# Patient Record
Sex: Male | Born: 1965 | Race: Black or African American | Hispanic: No | Marital: Single | Smoking: Never smoker
Health system: Southern US, Community
[De-identification: ages and names within clinical notes are randomized; demographics above are authoritative.]

## PROBLEM LIST (undated history)

## (undated) DIAGNOSIS — E119 Type 2 diabetes mellitus without complications: Secondary | ICD-10-CM

## (undated) HISTORY — PX: INGUINAL HERNIA REPAIR: SUR1180

## (undated) HISTORY — PX: OTHER SURGICAL HISTORY: SHX169

---

## 2001-02-13 ENCOUNTER — Emergency Department (HOSPITAL_COMMUNITY): Admission: EM | Admit: 2001-02-13 | Discharge: 2001-02-13 | Payer: Self-pay

## 2002-12-06 ENCOUNTER — Emergency Department (HOSPITAL_COMMUNITY): Admission: EM | Admit: 2002-12-06 | Discharge: 2002-12-06 | Payer: Self-pay | Admitting: Emergency Medicine

## 2003-01-31 ENCOUNTER — Encounter: Payer: Self-pay | Admitting: Emergency Medicine

## 2003-01-31 ENCOUNTER — Emergency Department (HOSPITAL_COMMUNITY): Admission: EM | Admit: 2003-01-31 | Discharge: 2003-01-31 | Payer: Self-pay | Admitting: Emergency Medicine

## 2003-06-11 ENCOUNTER — Emergency Department (HOSPITAL_COMMUNITY): Admission: EM | Admit: 2003-06-11 | Discharge: 2003-06-11 | Payer: Self-pay | Admitting: Emergency Medicine

## 2005-02-27 ENCOUNTER — Emergency Department (HOSPITAL_COMMUNITY): Admission: EM | Admit: 2005-02-27 | Discharge: 2005-02-27 | Payer: Self-pay | Admitting: Emergency Medicine

## 2006-03-11 ENCOUNTER — Emergency Department (HOSPITAL_COMMUNITY): Admission: EM | Admit: 2006-03-11 | Discharge: 2006-03-11 | Payer: Self-pay | Admitting: Emergency Medicine

## 2006-04-10 ENCOUNTER — Ambulatory Visit (HOSPITAL_COMMUNITY): Admission: RE | Admit: 2006-04-10 | Discharge: 2006-04-10 | Payer: Self-pay | Admitting: Ophthalmology

## 2010-01-18 ENCOUNTER — Encounter
Admission: RE | Admit: 2010-01-18 | Discharge: 2010-01-18 | Payer: Self-pay | Source: Home / Self Care | Attending: Endocrinology | Admitting: Endocrinology

## 2010-03-20 ENCOUNTER — Encounter
Admission: RE | Admit: 2010-03-20 | Discharge: 2010-05-08 | Payer: Self-pay | Source: Home / Self Care | Attending: Endocrinology | Admitting: Endocrinology

## 2010-06-12 ENCOUNTER — Ambulatory Visit: Payer: Self-pay | Admitting: Dietician

## 2010-06-12 ENCOUNTER — Encounter: Admit: 2010-06-12 | Payer: Self-pay | Attending: Obstetrics and Gynecology | Admitting: Obstetrics and Gynecology

## 2010-06-12 ENCOUNTER — Encounter: Payer: BC Managed Care – PPO | Attending: Endocrinology | Admitting: Dietician

## 2010-06-12 DIAGNOSIS — Z713 Dietary counseling and surveillance: Secondary | ICD-10-CM | POA: Insufficient documentation

## 2010-06-12 DIAGNOSIS — E109 Type 1 diabetes mellitus without complications: Secondary | ICD-10-CM | POA: Insufficient documentation

## 2010-08-24 NOTE — Op Note (Signed)
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   SURGERY:  Ahmed glaucoma valve placement with pericardial patch graft,  right eye.   ANESTHESIA:  Local peribulbar with MAC.   SURGEON:  Trish Fountain, MD   ESTIMATED BLOOD LOSS:  5 mL.   COMPLICATIONS:  None.   HISTORY:  This is a 45 year old type 1 diabetic whose blood sugars have  never been under very good control.  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  The following is a description of the procedure.   After the right eye was anesthetized using 0.75% Marcaine and 2%  lidocaine with epinephrine in a peribulbar injection with Wydase, the  right eye was prepped and draped in the usual sterile manner. A  lid  speculum was placed in the right eye.  A 5-0 silk suture was placed  through the superior cornea near the limbus in order to rotate the eye  inferiorly and this was clipped with mosquito forceps to the drape in  order to rotate the eye inferiorly.  A sharp Westcott scissors was used  at the 12 o'clock position with conjunctival forceps.  A peritomy was  created at the 12 o'clock position approximately from the 1 o'clock to  the 11 o'clock position superiorly.  This peritomy was extended  backwards underneath the conjunctiva in  wide sweeps using the blunt  Westcott scissors.  Once the conjunctiva was freed up from a large  portion of the superior area of the sclera, the Ahmed valve was placed  beneath the conjunctiva and sewed into position using two 10-0 nylon  sutures which were attached to the sclera to position the Ahmed valve  superiorly.  The Ahmed valve tube was then placed into the anterior  chamber after making a small incision at the limbus into the anterior  chamber just anterior to the iris.  The Ahmed valve was then covered  with pericardial graft tissue in order to keep the overlying conjunctiva  from eroding.  The conjunctiva was pulled down over the Ahmed valve and  over the fitted pericardial patch graft and sewn into position over the  limbus using 5-0 Vicryl locking sutures.  Once this was performed, the  anterior chamber was checked to make certain that the anterior chamber  was not flat.  There was a small bleb formation surrounding the Ahmed  valve.  The 6-0 silk suture was removed from  the  corneal limbus and antibiotic drops were placed in the patient's right  eye.  Betadine solution 5% was placed in the medial and lateral canthus  for approximately 1 minute and then rinsed from the eye.  The lid  speculum was removed from the eye and the patient went to the recovery  room in satisfactory condition and is to follow up in 1 day.      Trish Fountain, MD  Electronically Signed     PVK/MEDQ  D:  10/15/2006  T:  10/16/2006  Job:  [id number]

## 2010-09-17 ENCOUNTER — Encounter: Payer: BC Managed Care – PPO | Attending: Endocrinology | Admitting: Dietician

## 2010-09-17 DIAGNOSIS — Z713 Dietary counseling and surveillance: Secondary | ICD-10-CM | POA: Insufficient documentation

## 2010-09-17 DIAGNOSIS — E109 Type 1 diabetes mellitus without complications: Secondary | ICD-10-CM | POA: Insufficient documentation

## 2012-04-28 ENCOUNTER — Emergency Department (HOSPITAL_COMMUNITY)
Admission: EM | Admit: 2012-04-28 | Discharge: 2012-04-28 | Disposition: A | Discharge status: Home / Self Care | Payer: BC Managed Care – PPO | Attending: Emergency Medicine | Admitting: Emergency Medicine

## 2012-04-28 ENCOUNTER — Encounter (HOSPITAL_COMMUNITY): Payer: Self-pay | Admitting: Neurology

## 2012-04-28 ENCOUNTER — Emergency Department (HOSPITAL_COMMUNITY): Payer: BC Managed Care – PPO

## 2012-04-28 DIAGNOSIS — E1169 Type 2 diabetes mellitus with other specified complication: Secondary | ICD-10-CM | POA: Insufficient documentation

## 2012-04-28 DIAGNOSIS — R110 Nausea: Secondary | ICD-10-CM | POA: Insufficient documentation

## 2012-04-28 DIAGNOSIS — Z79899 Other long term (current) drug therapy: Secondary | ICD-10-CM | POA: Insufficient documentation

## 2012-04-28 DIAGNOSIS — Z794 Long term (current) use of insulin: Secondary | ICD-10-CM | POA: Insufficient documentation

## 2012-04-28 DIAGNOSIS — R5381 Other malaise: Secondary | ICD-10-CM | POA: Insufficient documentation

## 2012-04-28 DIAGNOSIS — Z7982 Long term (current) use of aspirin: Secondary | ICD-10-CM | POA: Insufficient documentation

## 2012-04-28 DIAGNOSIS — T68XXXA Hypothermia, initial encounter: Secondary | ICD-10-CM

## 2012-04-28 DIAGNOSIS — R680 Hypothermia, not associated with low environmental temperature: Secondary | ICD-10-CM | POA: Insufficient documentation

## 2012-04-28 DIAGNOSIS — R5383 Other fatigue: Secondary | ICD-10-CM | POA: Insufficient documentation

## 2012-04-28 DIAGNOSIS — E162 Hypoglycemia, unspecified: Secondary | ICD-10-CM

## 2012-04-28 HISTORY — DX: Type 2 diabetes mellitus without complications: E11.9

## 2012-04-28 LAB — COMPREHENSIVE METABOLIC PANEL
ALT: 23 U/L (ref 0–53)
Alkaline Phosphatase: 74 U/L (ref 39–117)
BUN: 15 mg/dL (ref 6–23)
CO2: 22 mEq/L (ref 19–32)
Chloride: 105 mEq/L (ref 96–112)
GFR calc Af Amer: >90 mL/min (ref >90)
GFR calc non Af Amer: >90 mL/min (ref >90)
Glucose, Bld: 115 mg/dL — ABNORMAL HIGH (ref 70–99)
Potassium: 4.1 mEq/L (ref 3.5–5.1)
Sodium: 139 mEq/L (ref 135–145)
Total Bilirubin: 0.5 mg/dL (ref 0.3–1.2)

## 2012-04-28 LAB — CBC WITH DIFFERENTIAL/PLATELET
Eosinophils Absolute: 0.1 10*3/uL (ref 0.0–0.7)
Hemoglobin: 15.9 g/dL (ref 13.0–17.0)
Lymphocytes Relative: 22 % (ref 12–46)
Lymphs Abs: 1.1 10*3/uL (ref 0.7–4.0)
MCH: 30.8 pg (ref 26.0–34.0)
Monocytes Relative: 6 % (ref 3–12)
Neutro Abs: 3.6 10*3/uL (ref 1.7–7.7)
Neutrophils Relative %: 71 % (ref 43–77)
RBC: 5.16 MIL/uL (ref 4.22–5.81)
WBC: 5.1 10*3/uL (ref 4.0–10.5)

## 2012-04-28 LAB — POCT I-STAT TROPONIN I: Troponin i, poc: 0 ng/mL (ref 0.00–0.08)

## 2012-04-28 LAB — LIPASE, BLOOD: Lipase: 15 U/L (ref 11–59)

## 2012-04-28 LAB — GLUCOSE, CAPILLARY: Glucose-Capillary: 67 mg/dL — ABNORMAL LOW (ref 70–99)

## 2012-04-28 MED ORDER — DEXTROSE-NACL 5-0.9 % IV SOLN
Freq: Once | INTRAVENOUS | Status: AC
  Administered 2012-04-28: 13:00:00 via INTRAVENOUS

## 2012-04-28 MED ORDER — DEXTROSE 50 % IV SOLN
50.0000 mL | Freq: Once | INTRAVENOUS | Status: AC
  Administered 2012-04-28: 50 mL via INTRAVENOUS

## 2012-04-28 MED ORDER — DEXTROSE 50 % IV SOLN
INTRAVENOUS | Status: AC
  Filled 2012-04-28: qty 50

## 2012-04-28 NOTE — ED Notes (Signed)
Dr Bebe Shaggy informed of swallowing screen , blood sugar and rectal temp - Ok given to give pt something to eat.  Sandwich and OJ set up for pt, eatting at this time .

## 2012-04-28 NOTE — ED Notes (Signed)
cbg was 161 informed rn

## 2012-04-28 NOTE — ED Notes (Signed)
accucheck 101

## 2012-04-28 NOTE — ED Notes (Signed)
CBG was 39. Notified Nurse Kara Mead.

## 2012-04-28 NOTE — ED Notes (Signed)
accucheck 67

## 2012-04-28 NOTE — ED Notes (Signed)
Per ems- CBG 28 given 12.5 glucose, increased to 112. Pt on monitor HR 30-40. EKG SB. Most recent CBG 97 PTA. Pt is alert and oriented. Denies any pain or sob. Pt very diaphoretic initially. Speech is slurred which is normal per baseline. BP 152/90, RR 20-22, 100% RA. 18 L. AC.

## 2012-04-28 NOTE — ED Notes (Signed)
Attempts to obtain blood have been unsuccessful - 2nd IV sited 18# and unable to get blood for labs- Flushed well . Lab called to come and attempt to obtain blood again

## 2012-04-28 NOTE — ED Provider Notes (Signed)
History     CSN: [id number]  Arrival date & time 04/28/12  1231   First MD Initiated Contact with Patient 04/28/12 1236      Chief Complaint  Patient presents with  . Hypoglycemia    Patient is a 47 y.o. male presenting with weakness. The history is provided by the patient.  Weakness The primary symptoms include nausea. Episode onset: earlier today. The symptoms are unchanged. The neurological symptoms are diffuse.  Additional symptoms include weakness.  his symptoms are improved by glucose His symptoms are worsened by nothing  Pt presents hypoglycemia.  Per EMS, pt was found to have glucose of 28 and he was given glucose and he improved He reports he takes humalog, last dose was this morning He denies headache/cp/sob.  No HA is reported.     Past Medical History  Diagnosis Date  . Diabetes mellitus without complication     No past surgical history on file.  No family history on file.  History  Substance Use Topics  . Smoking status: Not on file  . Smokeless tobacco: Not on file  . Alcohol Use:       Review of Systems  Constitutional: Positive for fatigue.  Respiratory: Negative for shortness of breath.   Gastrointestinal: Positive for nausea.  Musculoskeletal: Negative for back pain.  Skin: Positive for color change.  Neurological: Positive for weakness.  Psychiatric/Behavioral: Negative for agitation.  All other systems reviewed and are negative.    Allergies  Review of patient's allergies indicates no known allergies.  Home Medications  No current outpatient prescriptions on file.   Physical Exam CONSTITUTIONAL: pt appears somnolent but arousable.  He is diaphoretic HEAD AND FACE: Normocephalic/atraumatic EYES: evidence of prior surgery noted, no acute trauma noted ENMT: Mucous membranes moist NECK: supple no meningeal signs CV: S1/S2 noted, no murmurs/rubs/gallops noted LUNGS: Lungs are clear to auscultation bilaterally, no apparent  distress ABDOMEN: soft, nontender, no rebound or guarding GU:no cva tenderness NEURO: Pt is somnolent but easily arousable, moves all extremitiesx4, no arm or leg drift.   EXTREMITIES: pulses normal, full ROM SKIN: warm, color normal PSYCH: no abnormalities of mood noted  ED Course  Procedures    Labs Reviewed  CBC WITH DIFFERENTIAL  COMPREHENSIVE METABOLIC PANEL  LIPASE, BLOOD    12:50 PM Pt seen on arrival.  He is diaphoretic and somnolent.  Suspect recurrent hypoglycemia.  Will follow closey 4:01 PM Pt now at baseline (mother confirmed) He reports he takes metformin in the evening and humalog the morning His glucose is improving He will need to be monitored in the ED to ensure it is improving and that his hypothermia is improving D/w dr Juleen China to f/u on repeat CBG and temp check He will need hold all diabetic meds (humalog/lantua/metforming) until he follows up with endocrinology  MDM  Nursing notes including past medical history and social history reviewed and considered in documentation Previous records reviewed and considered Labs/vital reviewed and considered         Date: 04/28/2012  Rate: 42  Rhythm: sinus bradycardia  QRS Axis: normal  Intervals: normal  ST/T Wave abnormalities: nonspecific ST changes  Conduction Disutrbances:none  Narrative Interpretation:   Old EKG Reviewed: changes noted and rate is slower today    Joya Gaskins, MD 04/28/12 1603

## 2012-04-28 NOTE — ED Provider Notes (Signed)
Assumed care from Dr Bebe Shaggy in sign out. Pt's glucose has stabilized. Pt's mother is now at bedside and says pt is at his baseline. Now euthermic as well. Feel safe for DC. Pt instructed to hold diabetes meds for the rest of the day. Return precautions discussed.   Raeford Razor, MD 04/28/12 1630

## 2012-12-11 ENCOUNTER — Other Ambulatory Visit: Payer: Self-pay | Admitting: Family Medicine

## 2012-12-11 DIAGNOSIS — N5082 Scrotal pain: Secondary | ICD-10-CM

## 2012-12-16 ENCOUNTER — Other Ambulatory Visit: Payer: Self-pay | Admitting: Family Medicine

## 2012-12-16 DIAGNOSIS — K4090 Unilateral inguinal hernia, without obstruction or gangrene, not specified as recurrent: Secondary | ICD-10-CM

## 2012-12-17 ENCOUNTER — Ambulatory Visit
Admission: RE | Admit: 2012-12-17 | Discharge: 2012-12-17 | Disposition: A | Discharge status: Home / Self Care | Payer: BC Managed Care – PPO | Source: Ambulatory Visit | Attending: Family Medicine | Admitting: Family Medicine

## 2012-12-17 ENCOUNTER — Other Ambulatory Visit: Payer: BC Managed Care – PPO

## 2012-12-17 DIAGNOSIS — K4090 Unilateral inguinal hernia, without obstruction or gangrene, not specified as recurrent: Secondary | ICD-10-CM

## 2012-12-17 MED ORDER — IOHEXOL 300 MG/ML  SOLN
100.0000 mL | Freq: Once | INTRAMUSCULAR | Status: AC | PRN
  Administered 2012-12-17: 100 mL via INTRAVENOUS

## 2012-12-24 ENCOUNTER — Encounter (INDEPENDENT_AMBULATORY_CARE_PROVIDER_SITE_OTHER): Payer: Self-pay | Admitting: General Surgery

## 2012-12-24 ENCOUNTER — Telehealth (INDEPENDENT_AMBULATORY_CARE_PROVIDER_SITE_OTHER): Payer: Self-pay

## 2012-12-24 ENCOUNTER — Ambulatory Visit (INDEPENDENT_AMBULATORY_CARE_PROVIDER_SITE_OTHER): Payer: BC Managed Care – PPO | Admitting: General Surgery

## 2012-12-24 VITALS — BP 118/80 | HR 58 | Resp 16 | Ht 72.0 in | Wt 148.0 lb

## 2012-12-24 DIAGNOSIS — K4090 Unilateral inguinal hernia, without obstruction or gangrene, not specified as recurrent: Secondary | ICD-10-CM

## 2012-12-24 NOTE — Progress Notes (Signed)
Subjective:     Patient ID: Edward Greer, male   DOB: 19-Feb-1966, 47 y.o.   MRN: [id number]  HPI The patient is a 47 year old male who is referred by Dr. Gerda Diss for evaluation of right inguinal hernia. Patient states that approximately a week ago he noticed a heavy sensation to his right inguinal area. Patient underwent CT scan which revealed a very small hernia containing fat. The patient states she has no pain on that side is able to go about his daily activities and his recreational activities. His main concern is that of it becoming emergent. He states he has no shown no bulgehas had no signs or symptoms of incarceration  Of note patient's has had previous open bilateral inguinal hernia repair at the age of 20.  Review of Systems  Constitutional: Negative.   HENT: Negative.   Respiratory: Negative.   Cardiovascular: Negative.   Gastrointestinal: Negative.   Neurological: Negative.   All other systems reviewed and are negative.       Objective:   Physical Exam  Constitutional: He is oriented to person, place, and time. He appears well-developed and well-nourished.  HENT:  Head: Normocephalic and atraumatic.  Eyes: Conjunctivae and EOM are normal. Pupils are equal, round, and reactive to light.  Neck: Normal range of motion. Neck supple.  Cardiovascular: Normal rate, regular rhythm and normal heart sounds.   Pulmonary/Chest: Effort normal and breath sounds normal.  Abdominal: Soft. Bowel sounds are normal. A hernia is present. Hernia confirmed positive in the right inguinal area. Hernia confirmed negative in the left inguinal area.  Musculoskeletal: Normal range of motion.  Neurological: He is alert and oriented to person, place, and time.  Skin: Skin is warm and dry.       Assessment:     47 year old male with an asymptomatic right inguinal hernia     Plan:     1. I discussed with the patient the risks and benefits of repair, and the ability and to continue with his  daily activities and recreational activities. I do not believe this would become incarcerated emergent seen. Since he is minimal discomfort at this time and no pain I believe this does not do so had to be fixed at this point. 2. If his hernia becomes more symptomatic he can return for laparoscopic hernia repair.

## 2012-12-24 NOTE — Telephone Encounter (Signed)
Called and left message for patient RE:  appt need's to be r/s due to Dr. Biagio Quint not being in office today.  Dr. Derrell Lolling will see patient today at scheduled time of 9:15 or we can reschedule to 12/31/12 @ 9:15am or 01/01/13 @ 11:15 w/Dr. Biagio Quint

## 2012-12-31 ENCOUNTER — Encounter (INDEPENDENT_AMBULATORY_CARE_PROVIDER_SITE_OTHER): Payer: Self-pay

## 2013-03-19 ENCOUNTER — Emergency Department (HOSPITAL_COMMUNITY)
Admission: EM | Admit: 2013-03-19 | Discharge: 2013-03-20 | Disposition: A | Discharge status: Home / Self Care | Payer: BC Managed Care – PPO | Attending: Emergency Medicine | Admitting: Emergency Medicine

## 2013-03-19 ENCOUNTER — Emergency Department (HOSPITAL_COMMUNITY): Payer: BC Managed Care – PPO

## 2013-03-19 ENCOUNTER — Encounter (HOSPITAL_COMMUNITY): Payer: Self-pay | Admitting: Emergency Medicine

## 2013-03-19 DIAGNOSIS — E1169 Type 2 diabetes mellitus with other specified complication: Secondary | ICD-10-CM | POA: Insufficient documentation

## 2013-03-19 DIAGNOSIS — Z79899 Other long term (current) drug therapy: Secondary | ICD-10-CM | POA: Insufficient documentation

## 2013-03-19 DIAGNOSIS — Y9289 Other specified places as the place of occurrence of the external cause: Secondary | ICD-10-CM | POA: Insufficient documentation

## 2013-03-19 DIAGNOSIS — IMO0002 Reserved for concepts with insufficient information to code with codable children: Secondary | ICD-10-CM | POA: Insufficient documentation

## 2013-03-19 DIAGNOSIS — Z7982 Long term (current) use of aspirin: Secondary | ICD-10-CM | POA: Insufficient documentation

## 2013-03-19 DIAGNOSIS — R4182 Altered mental status, unspecified: Secondary | ICD-10-CM | POA: Insufficient documentation

## 2013-03-19 DIAGNOSIS — I498 Other specified cardiac arrhythmias: Secondary | ICD-10-CM | POA: Insufficient documentation

## 2013-03-19 DIAGNOSIS — R001 Bradycardia, unspecified: Secondary | ICD-10-CM

## 2013-03-19 DIAGNOSIS — T68XXXA Hypothermia, initial encounter: Secondary | ICD-10-CM | POA: Insufficient documentation

## 2013-03-19 DIAGNOSIS — E162 Hypoglycemia, unspecified: Secondary | ICD-10-CM

## 2013-03-19 DIAGNOSIS — Y939 Activity, unspecified: Secondary | ICD-10-CM | POA: Insufficient documentation

## 2013-03-19 DIAGNOSIS — Z794 Long term (current) use of insulin: Secondary | ICD-10-CM | POA: Insufficient documentation

## 2013-03-19 DIAGNOSIS — X31XXXA Exposure to excessive natural cold, initial encounter: Secondary | ICD-10-CM | POA: Insufficient documentation

## 2013-03-19 LAB — GLUCOSE, CAPILLARY
Glucose-Capillary: 93 mg/dL (ref 70–99)
Glucose-Capillary: 70 mg/dL (ref 70–99)
Glucose-Capillary: 163 mg/dL — ABNORMAL HIGH (ref 70–99)

## 2013-03-19 LAB — CBC WITH DIFFERENTIAL/PLATELET
Basophils Relative: 1 % (ref 0–1)
Eosinophils Absolute: 0.1 10*3/uL (ref 0.0–0.7)
Lymphs Abs: 1.5 10*3/uL (ref 0.7–4.0)
MCH: 31 pg (ref 26.0–34.0)
MCHC: 35.2 g/dL (ref 30.0–36.0)
Neutrophils Relative %: 59 % (ref 43–77)
Platelets: 200 10*3/uL (ref 150–400)
RBC: 5.04 MIL/uL (ref 4.22–5.81)
RDW: 12.7 % (ref 11.5–15.5)

## 2013-03-19 LAB — BASIC METABOLIC PANEL
Creatinine, Ser: 0.75 mg/dL (ref 0.50–1.35)
GFR calc Af Amer: >90 mL/min (ref >90)
GFR calc non Af Amer: >90 mL/min (ref >90)
Potassium: 3.8 mEq/L (ref 3.5–5.1)
Sodium: 139 mEq/L (ref 135–145)

## 2013-03-19 LAB — URINALYSIS, ROUTINE W REFLEX MICROSCOPIC
Leukocytes, UA: NEGATIVE
Protein, ur: NEGATIVE mg/dL
Urobilinogen, UA: 0.2 mg/dL (ref 0.0–1.0)
pH: 5 (ref 5.0–8.0)

## 2013-03-19 LAB — ETHANOL: Alcohol, Ethyl (B): <11 mg/dL (ref 0–11)

## 2013-03-19 LAB — RAPID URINE DRUG SCREEN, HOSP PERFORMED
Amphetamines: NOT DETECTED
Cocaine: NOT DETECTED
Opiates: NOT DETECTED

## 2013-03-19 NOTE — ED Provider Notes (Signed)
I saw and evaluated the patient, reviewed the resident's note and I agree with the findings and plan.  EKG Interpretation    Date/Time:  Friday March 19 2013 17:30:31 EST Ventricular Rate:  39 PR Interval:  153 QRS Duration: 138 QT Interval:  535 QTC Calculation: 431 R Axis:   59 Text Interpretation:  Sinus bradycardia Prominent P waves, nondiagnostic Left bundle branch block No significant change was found Confirmed by [facility]  MD, TREY ([id number]) on 03/19/2013 7:20:20 PM            47 year old male with a history of insulin-dependent diabetes presenting with altered mental status and hypoglycemia. Administered D50 prior to arrival and altered mental status has cleared somewhat by the time of arrival.  Pt cleared further during ED stay, back to baseline.  Hypothermia resolved.  Stable for discharge.  Clinical Impression: 1. Hypoglycemia   2. Altered mental status   3. Hypothermia, initial encounter   4. Bradycardia       Candyce Churn, MD 03/21/13 1046

## 2013-03-19 NOTE — ED Notes (Signed)
Presents with EMS found outside unresponsive at [facility], unknown how long down, CBG 21 noted cold extremities and core, unable to get temperature. Bradycardia 30s, PVCs. Given amp of D50, CBG up to 163 at 5:15. Pt is alert, slower to respond, CBG at arrival is 90. HR 30s.

## 2013-03-19 NOTE — ED Provider Notes (Signed)
CSN: [id number]     Arrival date & time 03/19/13  1720 History   First MD Initiated Contact with Patient 03/19/13 1730     Chief Complaint  Patient presents with  . Hypoglycemia  . Bradycardia   (Consider location/radiation/quality/duration/timing/severity/associated sxs/prior Treatment) Patient is a 47 y.o. male presenting with hypoglycemia.  Hypoglycemia Associated symptoms: no vomiting    CHAI ROUTH is a 46 y.o. male who presents to the emergency department for concern of altered mental status.  Patient has history of IDDM and was reportedly found outside in the cold lethargic and not making sense.  EMS found patient altered and confused.  Blood glucose in the 20s.  1 amp of D50 administered prehospital and glucose and mental status started to improve.  Patient reports that he ate a very small lunch but still administered 10 units of his insulin.  He felt like he was getting low and that he should eat some moonpies but did not get around to it.  Now feeling better.  No complaints of pain.  No F/C, cough, urinary symptoms.  No other symptoms.  Past Medical History  Diagnosis Date  . Diabetes mellitus without complication    Past Surgical History  Procedure Laterality Date  . Inguinal hernia repair Bilateral   . Glaucoma valve placement Bilateral    History reviewed. No pertinent family history. History  Substance Use Topics  . Smoking status: Never Smoker   . Smokeless tobacco: Not on file  . Alcohol Use: No    Review of Systems  Constitutional: Negative for fever and chills.  HENT: Negative for congestion and sore throat.   Respiratory: Negative for cough.   Gastrointestinal: Negative for nausea, vomiting, abdominal pain, diarrhea and constipation.  Endocrine: Negative for polyuria.  Genitourinary: Negative for dysuria and hematuria.  Musculoskeletal: Negative for neck pain.  Skin: Negative for rash.  Neurological: Negative for headaches.  Psychiatric/Behavioral:  Negative.   All other systems reviewed and are negative.    Allergies  Review of patient's allergies indicates no known allergies.  Home Medications   Current Outpatient Rx  Name  Route  Sig  Dispense  Refill  . aspirin EC 81 MG tablet   Oral   Take 81 mg by mouth daily.         Marland Kitchen atorvastatin (LIPITOR) 20 MG tablet   Oral   Take 20 mg by mouth daily.         . brimonidine (ALPHAGAN P) 0.1 % SOLN   Both Eyes   Place 1 drop into both eyes 3 (three) times daily.          . dorzolamide-timolol (COSOPT) 22.3-6.8 MG/ML ophthalmic solution   Both Eyes   Place 1 drop into both eyes 2 (two) times daily.          . metFORMIN (GLUCOPHAGE) 500 MG tablet   Oral   Take 1,000 mg by mouth 2 (two) times daily with a meal.         . prednisoLONE acetate (PRED FORTE) 1 % ophthalmic suspension   Both Eyes   Place 1 drop into both eyes daily.          . Ascorbic Acid (VITAMIN C PO)   Oral   Take 1 tablet by mouth daily.         . benazepril (LOTENSIN) 20 MG tablet   Oral   Take 20 mg by mouth daily.         . insulin glargine (  LANTUS) 100 UNIT/ML injection   Subcutaneous   Inject 20 Units into the skin at bedtime.          . insulin lispro (HUMALOG) 100 UNIT/ML injection   Subcutaneous   Inject 5-8 Units into the skin 3 (three) times daily before meals. Take 5 units with breakfast,6 units with lunch, and 8 units with dinner          BP 141/83  Pulse 55  Temp(Src) 92 F (33.3 C) (Rectal)  Resp 12  SpO2 100% Physical Exam  Nursing note and vitals reviewed. Constitutional: He is oriented to person, place, and time. He appears well-developed and well-nourished. No distress.  HENT:  Head: Normocephalic and atraumatic.  Right Ear: External ear normal.  Left Ear: External ear normal.  Mouth/Throat: Oropharynx is clear and moist. No oropharyngeal exudate.  Eyes: Conjunctivae are normal. Pupils are equal, round, and reactive to light. Right eye exhibits no  discharge.  Neck: Normal range of motion. Neck supple. No tracheal deviation present.  Cardiovascular: Regular rhythm and intact distal pulses.  Bradycardia present.   Pulmonary/Chest: Effort normal. No respiratory distress. He has no wheezes. He has no rales.  Abdominal: Soft. He exhibits no distension. There is no tenderness. There is no rebound and no guarding.  Musculoskeletal: Normal range of motion.  Neurological: He is alert and oriented to person, place, and time. He has normal strength. No cranial nerve deficit or sensory deficit. GCS eye subscore is 4. GCS verbal subscore is 5. GCS motor subscore is 6.  Skin: Skin is warm and dry. No rash noted. He is not diaphoretic.  Psychiatric: He has a normal mood and affect.    ED Course  Procedures (including critical care time) Labs Review Labs Reviewed  URINALYSIS, ROUTINE W REFLEX MICROSCOPIC - Abnormal; Notable for the following:    Glucose, UA 500 (*)    Ketones, ur 15 (*)    All other components within normal limits  GLUCOSE, CAPILLARY  CBC WITH DIFFERENTIAL  BASIC METABOLIC PANEL  LACTIC ACID, PLASMA  ETHANOL  GLUCOSE, CAPILLARY  URINE RAPID DRUG SCREEN (HOSP PERFORMED)   Imaging Review Dg Chest Portable 1 View  03/19/2013   CLINICAL DATA:  Hypotension. Hypoglycemia. Bradycardia. Current history of diabetes.  EXAM: PORTABLE CHEST - 1 VIEW  COMPARISON:  04/28/2012, 04/10/2006.  FINDINGS: Cardiac silhouette normal and mediastinal contours unremarkable for the AP portable technique. Pulmonary parenchyma clear. Bronchovascular markings normal. Pulmonary vascularity normal. No pneumothorax. No visible pleural effusions. No significant interval change.  IMPRESSION: No acute cardiopulmonary disease.   Electronically Signed   By: Hulan Saas M.D.   On: 03/19/2013 17:50    EKG Interpretation    Date/Time:  Friday March 19 2013 17:30:31 EST Ventricular Rate:  39 PR Interval:  153 QRS Duration: 138 QT Interval:  535 QTC  Calculation: 431 R Axis:   59 Text Interpretation:  Sinus bradycardia Prominent P waves, nondiagnostic Left bundle branch block No significant change was found Confirmed by [facility]  MD, TREY ([id number]) on 03/19/2013 7:20:20 PM            MDM   1. Hypoglycemia   2. Altered mental status   3. Hypothermia, initial encounter   4. Bradycardia     ISHMEL ACEVEDO is a 47 y.o. male with history of IDDM who presents to the ED with AMS.  Enroute, patient diagnosed with hypoglycemia and given one amp of D50.  Now with GCS of 15 and at neurological baseline.  Temperature found to be 91.  Patient was outside and is likely hypothermic from environment.  Bair Hugger and warm blankets applied.  Patient slowly warming up.  HR also noted to be in high 30s/low 40s.  Sinus rhythm.  EKG with LBBB but this is old.  No acute changes.  Patient has history of sinus bradycardia.  No need for further workup.  After rewarming complete, patient safe for discharge home.  All questions answered.  Recommended closer checking of glucose and true sliding scale.  Patient understands.  Patient discharged.      Arloa Koh, MD 03/21/13 [phone]

## 2013-03-20 NOTE — ED Notes (Signed)
The pt  Has pulled off his bp cuff anf has taken off his ekg leads.  He appears confused.  Everything replaced.  Pt alert moves all extremities.  drowsy

## 2013-03-20 NOTE — ED Notes (Signed)
Patient left before getting discharge instructions. Staff report patient was ambulatory without difficulty with no complaints at time of discharge.

## 2013-03-21 NOTE — ED Provider Notes (Signed)
I saw and evaluated the patient, reviewed the resident's note and I agree with the findings and plan.  EKG Interpretation    Date/Time:  Friday March 19 2013 17:30:31 EST Ventricular Rate:  39 PR Interval:  153 QRS Duration: 138 QT Interval:  535 QTC Calculation: 431 R Axis:   59 Text Interpretation:  Sinus bradycardia Prominent P waves, nondiagnostic Left bundle branch block No significant change was found Confirmed by [facility]  MD, TREY ([id number]) on 03/19/2013 7:20:20 PM              Candyce Churn, MD 03/21/13 1046

## 2014-01-04 ENCOUNTER — Encounter (HOSPITAL_COMMUNITY): Payer: Self-pay | Admitting: Emergency Medicine

## 2014-01-04 ENCOUNTER — Emergency Department (HOSPITAL_COMMUNITY)
Admission: EM | Admit: 2014-01-04 | Discharge: 2014-01-04 | Disposition: A | Discharge status: Home / Self Care | Payer: BC Managed Care – PPO | Attending: Emergency Medicine | Admitting: Emergency Medicine

## 2014-01-04 ENCOUNTER — Emergency Department (HOSPITAL_COMMUNITY): Payer: BC Managed Care – PPO

## 2014-01-04 DIAGNOSIS — Z794 Long term (current) use of insulin: Secondary | ICD-10-CM | POA: Insufficient documentation

## 2014-01-04 DIAGNOSIS — S0990XA Unspecified injury of head, initial encounter: Secondary | ICD-10-CM | POA: Diagnosis not present

## 2014-01-04 DIAGNOSIS — Y9389 Activity, other specified: Secondary | ICD-10-CM | POA: Insufficient documentation

## 2014-01-04 DIAGNOSIS — Z7982 Long term (current) use of aspirin: Secondary | ICD-10-CM | POA: Diagnosis not present

## 2014-01-04 DIAGNOSIS — S0993XA Unspecified injury of face, initial encounter: Secondary | ICD-10-CM | POA: Insufficient documentation

## 2014-01-04 DIAGNOSIS — W1809XA Striking against other object with subsequent fall, initial encounter: Secondary | ICD-10-CM | POA: Diagnosis not present

## 2014-01-04 DIAGNOSIS — E119 Type 2 diabetes mellitus without complications: Secondary | ICD-10-CM | POA: Diagnosis not present

## 2014-01-04 DIAGNOSIS — Z79899 Other long term (current) drug therapy: Secondary | ICD-10-CM | POA: Diagnosis not present

## 2014-01-04 DIAGNOSIS — S199XXA Unspecified injury of neck, initial encounter: Secondary | ICD-10-CM

## 2014-01-04 DIAGNOSIS — S1093XA Contusion of unspecified part of neck, initial encounter: Secondary | ICD-10-CM

## 2014-01-04 DIAGNOSIS — Y9289 Other specified places as the place of occurrence of the external cause: Secondary | ICD-10-CM | POA: Insufficient documentation

## 2014-01-04 DIAGNOSIS — S0003XA Contusion of scalp, initial encounter: Secondary | ICD-10-CM | POA: Insufficient documentation

## 2014-01-04 DIAGNOSIS — S0083XA Contusion of other part of head, initial encounter: Secondary | ICD-10-CM | POA: Insufficient documentation

## 2014-01-04 NOTE — ED Notes (Signed)
Bed: WA04 Expected date:  Expected time:  Means of arrival:  Comments: Hold for triage 4 

## 2014-01-04 NOTE — Discharge Instructions (Signed)
Contusion °A contusion is a deep bruise. Contusions are the result of an injury that caused bleeding under the skin. The contusion may turn blue, purple, or yellow. Minor injuries will give you a painless contusion, but more severe contusions may stay painful and swollen for a few weeks.  °CAUSES  °A contusion is usually caused by a blow, trauma, or direct force to an area of the body. °SYMPTOMS  °· Swelling and redness of the injured area. °· Bruising of the injured area. °· Tenderness and soreness of the injured area. °· Pain. °DIAGNOSIS  °The diagnosis can be made by taking a history and physical exam. An X-ray, CT scan, or MRI may be needed to determine if there were any associated injuries, such as fractures. °TREATMENT  °Specific treatment will depend on what area of the body was injured. In general, the best treatment for a contusion is resting, icing, elevating, and applying cold compresses to the injured area. Over-the-counter medicines may also be recommended for pain control. Ask your caregiver what the best treatment is for your contusion. °HOME CARE INSTRUCTIONS  °· Put ice on the injured area. °· Put ice in a plastic bag. °· Place a towel between your skin and the bag. °· Leave the ice on for 15-20 minutes, 3-4 times a day, or as directed by your health care provider. °· Only take over-the-counter or prescription medicines for pain, discomfort, or fever as directed by your caregiver. Your caregiver may recommend avoiding anti-inflammatory medicines (aspirin, ibuprofen, and naproxen) for 48 hours because these medicines may increase bruising. °· Rest the injured area. °· If possible, elevate the injured area to reduce swelling. °SEEK IMMEDIATE MEDICAL CARE IF:  °· You have increased bruising or swelling. °· You have pain that is getting worse. °· Your swelling or pain is not relieved with medicines. °MAKE SURE YOU:  °· Understand these instructions. °· Will watch your condition. °· Will get help right  away if you are not doing well or get worse. °Document Released: 01/02/2005 Document Revised: 03/30/2013 Document Reviewed: 01/28/2011 °Exit[employer]® Patient Information ©2015 [employer]. This information is not intended to replace advice given to you by your health care provider. Make sure you discuss any questions you have with your health care provider. ° ° °Head Injury °You have received a head injury. It does not appear serious at this time. Headaches and vomiting are common following head injury. It should be easy to awaken from sleeping. Sometimes it is necessary for you to stay in the emergency department for a while for observation. Sometimes admission to the hospital may be needed. After injuries such as yours, most problems occur within the first 24 hours, but side effects may occur up to 7-10 days after the injury. It is important for you to carefully monitor your condition and contact your health care provider or seek immediate medical care if there is a change in your condition. °WHAT ARE THE TYPES OF HEAD INJURIES? °Head injuries can be as minor as a bump. Some head injuries can be more severe. More severe head injuries include: °· A jarring injury to the brain (concussion). °· A bruise of the brain (contusion). This mean there is bleeding in the brain that can cause swelling. °· A cracked skull (skull fracture). °· Bleeding in the brain that collects, clots, and forms a bump (hematoma). °WHAT CAUSES A HEAD INJURY? °A serious head injury is most likely to happen to someone who is in a car wreck and is not   wearing a seat belt. Other causes of major head injuries include bicycle or motorcycle accidents, sports injuries, and falls. °HOW ARE HEAD INJURIES DIAGNOSED? °A complete history of the event leading to the injury and your current symptoms will be helpful in diagnosing head injuries. Many times, pictures of the brain, such as CT or MRI are needed to see the extent of the injury. Often, an overnight  hospital stay is necessary for observation.  °WHEN SHOULD I SEEK IMMEDIATE MEDICAL CARE?  °You should get help right away if: °· You have confusion or drowsiness. °· You feel sick to your stomach (nauseous) or have continued, forceful vomiting. °· You have dizziness or unsteadiness that is getting worse. °· You have severe, continued headaches not relieved by medicine. Only take over-the-counter or prescription medicines for pain, fever, or discomfort as directed by your health care provider. °· You do not have normal function of the arms or legs or are unable to walk. °· You notice changes in the black spots in the center of the colored part of your eye (pupil). °· You have a clear or bloody fluid coming from your nose or ears. °· You have a loss of vision. °During the next 24 hours after the injury, you must stay with someone who can watch you for the warning signs. This person should contact local emergency services (911 in the [place] if you have seizures, you become unconscious, or you are unable to wake up. °HOW CAN I PREVENT A HEAD INJURY IN THE FUTURE? °The most important factor for preventing major head injuries is avoiding motor vehicle accidents.  To minimize the potential for damage to your head, it is crucial to wear seat belts while riding in motor vehicles. Wearing helmets while bike riding and playing collision sports (like football) is also helpful. Also, avoiding dangerous activities around the house will further help reduce your risk of head injury.  °WHEN CAN I RETURN TO NORMAL ACTIVITIES AND ATHLETICS? °You should be reevaluated by your health care provider before returning to these activities. If you have any of the following symptoms, you should not return to activities or contact sports until 1 week after the symptoms have stopped: °· Persistent headache. °· Dizziness or vertigo. °· Poor attention and concentration. °· Confusion. °· Memory problems. °· Nausea or vomiting. °· Fatigue or tire  easily. °· Irritability. °· Intolerant of bright lights or loud noises. °· Anxiety or depression. °· Disturbed sleep. °MAKE SURE YOU:  °· Understand these instructions. °· Will watch your condition. °· Will get help right away if you are not doing well or get worse. °Document Released: 03/25/2005 Document Revised: 03/30/2013 Document Reviewed: 11/30/2012 °ExitCare® Patient Information ©2015 [employer]. This information is not intended to replace advice given to you by your health care provider. Make sure you discuss any questions you have with your health care provider. ° °

## 2014-01-04 NOTE — ED Provider Notes (Signed)
TIME SEEN: 3:24 PM  CHIEF COMPLAINT: Facial injury  HPI: Patient is a 48 y.o. M with history of insulin-dependent diabetes, glaucoma who presents to the emergency department with a left-sided facial injury that occurred last night. He reports that he was helping a friend. He said furniture when something slid off the top of a piece of furniture he was caring and hit him in the left face. He states it knocked him backwards into the wall and then he fell onto the ground. He denies a loss of consciousness. He is not on anticoagulation. No numbness, tingling or focal weakness. He has not had any vomiting. He has a large laceration to his left cheek. He went to urgent care today for evaluation and they instructed to come to the ED as they thought he may need a CT of his face. Patient denies having any pain currently.  ROS: See HPI Constitutional: no fever  Eyes: no drainage  ENT: no runny nose   Cardiovascular:  no chest pain  Resp: no SOB  GI: no vomiting GU: no dysuria Integumentary: no rash  Allergy: no hives  Musculoskeletal: no leg swelling  Neurological: no slurred speech ROS otherwise negative  PAST MEDICAL HISTORY/PAST SURGICAL HISTORY:  Past Medical History  Diagnosis Date  . Diabetes mellitus without complication     MEDICATIONS:  Prior to Admission medications   Medication Sig Start Date End Date Taking? Authorizing Provider  Ascorbic Acid (VITAMIN C PO) Take 2 tablets by mouth daily.    Yes Historical Provider, MD  aspirin 81 MG chewable tablet Chew 81 mg by mouth daily.   Yes Historical Provider, MD  atorvastatin (LIPITOR) 20 MG tablet Take 20 mg by mouth daily.   Yes Historical Provider, MD  benazepril (LOTENSIN) 20 MG tablet Take 20 mg by mouth daily.   Yes Historical Provider, MD  brimonidine (ALPHAGAN P) 0.1 % SOLN Place 1 drop into both eyes 3 (three) times daily.    Yes Historical Provider, MD  dorzolamide-timolol (COSOPT) 22.3-6.8 MG/ML ophthalmic solution Place 1  drop into both eyes 2 (two) times daily.    Yes Historical Provider, MD  fluorometholone (FML FORTE) 0.25 % ophthalmic suspension Place 1 drop into both eyes 3 (three) times daily.   Yes Historical Provider, MD  insulin aspart (NOVOLOG FLEXPEN) 100 UNIT/ML SOPN FlexPen Inject 6-12 Units into the skin 3 (three) times daily with meals. Uses a sliding scale.   Yes Historical Provider, MD  insulin glargine (LANTUS) 100 UNIT/ML injection Inject 24 Units into the skin at bedtime.    Yes Historical Provider, MD  metFORMIN (GLUCOPHAGE) 500 MG tablet Take 1,000 mg by mouth 2 (two) times daily with a meal.   Yes Historical Provider, MD  polyvinyl alcohol (LIQUIFILM TEARS) 1.4 % ophthalmic solution Place 1 drop into both eyes every 4 (four) hours as needed for dry eyes.   Yes Historical Provider, MD  PROAIR HFA 108 (90 BASE) MCG/ACT inhaler Inhale 1-2 puffs into the lungs every 6 (six) hours as needed for shortness of breath.  03/18/13  Yes Historical Provider, MD    ALLERGIES:  No Known Allergies  SOCIAL HISTORY:  History  Substance Use Topics  . Smoking status: Never Smoker   . Smokeless tobacco: Never Used  . Alcohol Use: No    FAMILY HISTORY: History reviewed. No pertinent family history.  EXAM: BP 158/88  Pulse 73  Temp(Src) 98.2 F (36.8 C) (Oral)  Resp 18  SpO2 98% CONSTITUTIONAL: Alert and oriented and  responds appropriately to questions. Well-appearing; well-nourished; GCS 15 HEAD: Normocephalic; patient has some abrasions to his left forehead and associated hematoma to this area as well as a hematoma at his left occipital region; there is a large C. shaped superficial laceration that is hemostatic to his left cheek with good approximation of the wound edges EYES: Conjunctivae clear, PERRL, EOMI, patient has some mild left periorbital ecchymosis, bilateral haziness of his cornea which he reports is chronic, no hyphema, no subconjunctival hemorrhage ENT: normal nose; no rhinorrhea;  moist mucous membranes; pharynx without lesions noted; no dental injury; no hemotypanum; no septal hematoma, patient has a small abrasion to his left lateral tongue and 2 small bite marks to his bottom lower lip NECK: Supple, no meningismus, no LAD; no midline spinal tenderness, step-off or deformity CARD: RRR; S1 and S2 appreciated; no murmurs, no clicks, no rubs, no gallops RESP: Normal chest excursion without splinting or tachypnea; breath sounds clear and equal bilaterally; no wheezes, no rhonchi, no rales; chest wall stable, nontender to palpation ABD/GI: Normal bowel sounds; non-distended; soft, non-tender, no rebound, no guarding PELVIS:  stable, nontender to palpation BACK:  The back appears normal and is non-tender to palpation, there is no CVA tenderness; no midline spinal tenderness, step-off or deformity EXT: Normal ROM in all joints; non-tender to palpation; no edema; normal capillary refill; no cyanosis    SKIN: Normal color for age and race; warm NEURO: Moves all extremities equally, sensation to light touch intact diffusely, cranial nerves 2 through contact PSYCH: The patient's mood and manner are appropriate. Grooming and personal hygiene are appropriate.  MEDICAL DECISION MAKING: Patient here with a facial injury. Given his symptoms are greater than 12 hours ago and he is still neurologically intact, do not feel he needs a CT of his head or cervical spine. I also feel that given he has no pain, I feel a facial fracture is unlikely but family is very concerned given urgent care sent him here for a CT of his face and are requesting this today. Will obtain a head CT. His tetanus is up-to-date. Discussed with family that I am unable to repair his laceration given this was greater than 12 hours ago. He denies wanting any pain medicine at this time.  ED PROGRESS: CT imaging shows no fracture. I feel he is safe to be discharged home. Discussed with her head injury return precautions.  Discussed supportive care instructions. Patient and mother at bedside verbalize understanding and are comfortable with plan.     Layla Maw Ward, DO 01/05/14 [phone]

## 2014-03-08 ENCOUNTER — Emergency Department (HOSPITAL_COMMUNITY)
Admission: EM | Admit: 2014-03-08 | Discharge: 2014-03-08 | Disposition: A | Discharge status: Home / Self Care | Payer: BC Managed Care – PPO | Attending: Emergency Medicine | Admitting: Emergency Medicine

## 2014-03-08 ENCOUNTER — Encounter (HOSPITAL_COMMUNITY): Payer: Self-pay | Admitting: Emergency Medicine

## 2014-03-08 DIAGNOSIS — Z794 Long term (current) use of insulin: Secondary | ICD-10-CM | POA: Insufficient documentation

## 2014-03-08 DIAGNOSIS — E11649 Type 2 diabetes mellitus with hypoglycemia without coma: Secondary | ICD-10-CM | POA: Insufficient documentation

## 2014-03-08 DIAGNOSIS — E162 Hypoglycemia, unspecified: Secondary | ICD-10-CM

## 2014-03-08 DIAGNOSIS — Z7982 Long term (current) use of aspirin: Secondary | ICD-10-CM | POA: Diagnosis not present

## 2014-03-08 DIAGNOSIS — Z79899 Other long term (current) drug therapy: Secondary | ICD-10-CM | POA: Diagnosis not present

## 2014-03-08 LAB — CBG MONITORING, ED
GLUCOSE-CAPILLARY: 287 mg/dL — AB (ref 70–99)
Glucose-Capillary: 96 mg/dL (ref 70–99)

## 2014-03-08 NOTE — ED Notes (Signed)
Per EMS. Pt found unresponsive by coworker. Upon EMS arrival pt had HR 40, RR 4 and a CBG of 23. Was given an 25mg  dextrose IV, brought CBG up to 83 and pt became alert with stable vitals. Pt has hx of DM, has not eaten since yesterday afternoon. EMS gave pt peanut butter, crackers and coke prior to arrival

## 2014-03-08 NOTE — ED Provider Notes (Signed)
CSN: [id number]     Arrival date & time 03/08/14  [id number] History   First MD Initiated Contact with Patient 03/08/14 1002     Chief Complaint  Patient presents with  . hypoglycemia, treated prior to arrival       HPI Per EMS. Pt found unresponsive by coworker. Upon EMS arrival pt had HR 40, RR 4 and a CBG of 23. Was given an 25mg  dextrose IV, brought CBG up to 83 and pt became alert with stable vitals. Pt has hx of DM, has not eaten since yesterday afternoon. EMS gave pt peanut butter, crackers and coke prior to arrival Past Medical History  Diagnosis Date  . Diabetes mellitus without complication    Past Surgical History  Procedure Laterality Date  . Inguinal hernia repair Bilateral   . Glaucoma valve placement Bilateral    History reviewed. No pertinent family history. History  Substance Use Topics  . Smoking status: Never Smoker   . Smokeless tobacco: Never Used  . Alcohol Use: No    Review of Systems  All other systems reviewed and are negative.     Allergies  Review of patient's allergies indicates no known allergies.  Home Medications   Prior to Admission medications   Medication Sig Start Date End Date Taking? Authorizing Provider  Ascorbic Acid (VITAMIN C PO) Take 2 tablets by mouth daily with breakfast.    Yes Historical Provider, MD  aspirin 81 MG chewable tablet Chew 81 mg by mouth daily with breakfast.    Yes Historical Provider, MD  atorvastatin (LIPITOR) 20 MG tablet Take 20 mg by mouth daily with breakfast.    Yes Historical Provider, MD  benazepril (LOTENSIN) 20 MG tablet Take 20 mg by mouth daily.   Yes Historical Provider, MD  brimonidine (ALPHAGAN P) 0.1 % SOLN Place 1 drop into both eyes 3 (three) times daily.    Yes Historical Provider, MD  dorzolamide-timolol (COSOPT) 22.3-6.8 MG/ML ophthalmic solution Place 1 drop into both eyes 2 (two) times daily.    Yes Historical Provider, MD  insulin aspart (NOVOLOG FLEXPEN) 100 UNIT/ML SOPN FlexPen Inject 6-12  Units into the skin 3 (three) times daily with meals. Takes 9 units at breakfast, 6 units at lunch, and 12 units at supper   Yes Historical Provider, MD  insulin glargine (LANTUS) 100 UNIT/ML injection Inject 24 Units into the skin at bedtime.    Yes Historical Provider, MD  latanoprost (XALATAN) 0.005 % ophthalmic solution Place 1 drop into both eyes 2 (two) times daily.   Yes Historical Provider, MD  metFORMIN (GLUCOPHAGE) 500 MG tablet Take 1,000 mg by mouth 2 (two) times daily with a meal.   Yes Historical Provider, MD  polyvinyl alcohol (LIQUIFILM TEARS) 1.4 % ophthalmic solution Place 1 drop into both eyes every 4 (four) hours as needed for dry eyes.   Yes Historical Provider, MD  PROAIR HFA 108 (90 BASE) MCG/ACT inhaler Inhale 1-2 puffs into the lungs every 6 (six) hours as needed for shortness of breath.  03/18/13  Yes Historical Provider, MD   BP 132/64 mmHg  Pulse 50  Temp(Src) 97.5 F (36.4 C) (Oral)  Resp 12  SpO2 100% Physical Exam Physical Exam  Nursing note and vitals reviewed. Constitutional: He is oriented to person, place, and time. He appears well-developed and well-nourished. No distress.  HENT:  Head: Normocephalic and atraumatic.  Eyes: Pupils are equal, round, and reactive to light.  Neck: Normal range of motion.  Cardiovascular: Normal rate and  intact distal pulses.   Pulmonary/Chest: No respiratory distress.  Abdominal: Normal appearance. He exhibits no distension.  Musculoskeletal: Normal range of motion.  Neurological: He is alert and oriented to person, place, and time. No cranial nerve deficit.  Skin: Skin is warm and dry. No rash noted.  Psychiatric: He has a normal mood and affect. His behavior is normal.   ED Course  Procedures (including critical care time) Labs Review Labs Reviewed  CBG MONITORING, ED - Abnormal; Notable for the following:    Glucose-Capillary 287 (*)    All other components within normal limits  CBG MONITORING, ED    Imaging  Review No results found.  Patient was given a [place] sandwich.  He remained awake and alert.  Blood sugar checked prior to discharge was 200.  MDM   Final diagnoses:  Hypoglycemia        Nelia Shiobert L Anneth Brunell, MD 03/08/14 1200

## 2014-03-08 NOTE — ED Notes (Signed)
Bed: WA11 Expected date:  Expected time:  Means of arrival:  Comments: EMS 

## 2014-03-08 NOTE — Discharge Instructions (Signed)
Low Blood Sugar °Low blood sugar (hypoglycemia) means that the level of sugar in your blood is lower than it should be. Signs of low blood sugar include: °· Getting sweaty. °· Feeling hungry. °· Feeling dizzy or weak. °· Feeling sleepier than normal. °· Feeling nervous. °· Headaches. °· Having a fast heartbeat. °Low blood sugar can happen fast and can be an emergency. Your doctor can do tests to check your blood sugar level. You can have low blood sugar and not have diabetes. °HOME CARE °· Check your blood sugar as told by your doctor. If it is less than 70 mg/dl or as told by your doctor, take 1 of the following: °¨ 3 to 4 glucose tablets. °¨ ½ cup clear juice. °¨ ½ cup soda pop, not diet. °¨ 1 cup milk. °¨ 5 to 6 hard candies. °· Recheck blood sugar after 15 minutes. Repeat until it is at the right level. °· Eat a snack if it is more than 1 hour until the next meal. °· Only take medicine as told by your doctor. °· Do not skip meals. Eat on time. °· Do not drink alcohol except with meals. °· Check your blood glucose before driving. °· Check your blood glucose before and after exercise. °· Always carry treatment with you, such as glucose pills. °· Always wear a medical alert bracelet if you have diabetes. °GET HELP RIGHT AWAY IF:  °· Your blood glucose goes below 70 mg/dl or as told by your doctor, and you: °¨ Are confused. °¨ Are not able to swallow. °¨ Pass out (faint). °· You cannot treat yourself. You may need someone to help you. °· You have low blood sugar problems often. °· You have problems from your medicines. °· You are not feeling better after 3 to 4 days. °· You have vision changes. °MAKE SURE YOU:  °· Understand these instructions. °· Will watch this condition. °· Will get help right away if you are not doing well or get worse. °Document Released: 06/19/2009 Document Revised: 06/17/2011 Document Reviewed: 06/19/2009 °[employer]® Patient Information ©2015 [employer]. This information is not intended to  replace advice given to you by your health care provider. Make sure you discuss any questions you have with your health care provider. ° °

## 2015-07-20 IMAGING — CT CT MAXILLOFACIAL W/O CM
1 series · 15 of 30 positions shown, 19 images · non-contrast
Comparison: None.

CLINICAL DATA: 48-year-old male moving fracture and hit by object
left cheek. History of glaucoma valve placement. Initial encounter.

EXAM:
CT MAXILLOFACIAL WITHOUT CONTRAST
TECHNIQUE: Multidetector CT imaging of the maxillofacial structures was
performed. Multiplanar CT image reconstructions were also generated.
A small metallic BB was placed on the right temple in order to
reliably differentiate right from left.

[Series 2: facial st · axial · 0.30mm/px · z∈[+1170,+1334]mm · 15 of 90 slices shown, 19 images]
[im 4/90  brain]
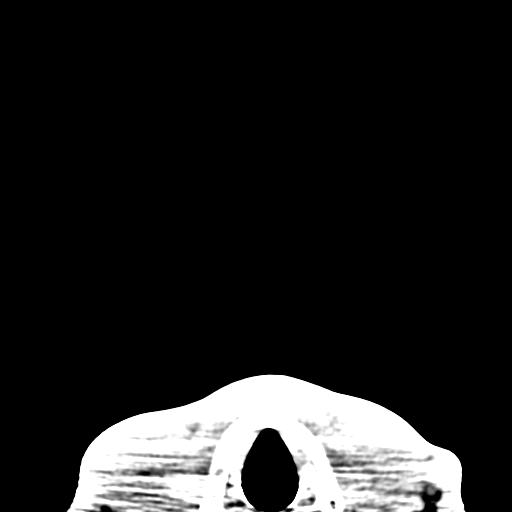
[im 4/90  bone]
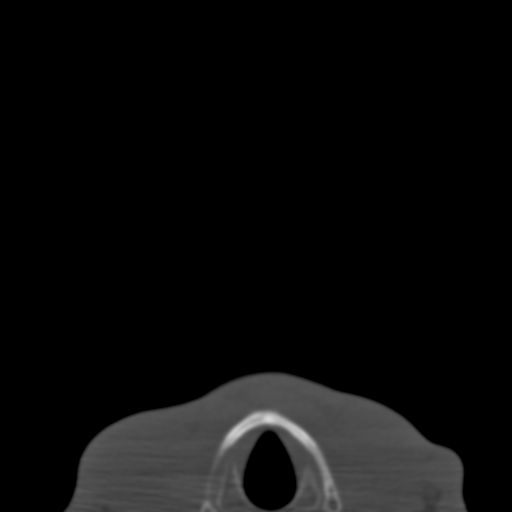
[im 10/90  bone]
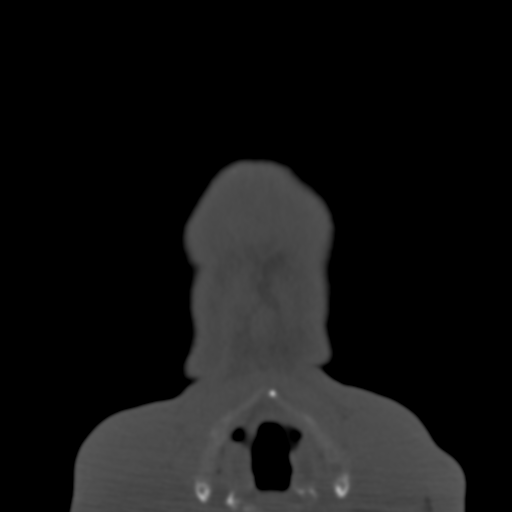
[im 16/90  bone]
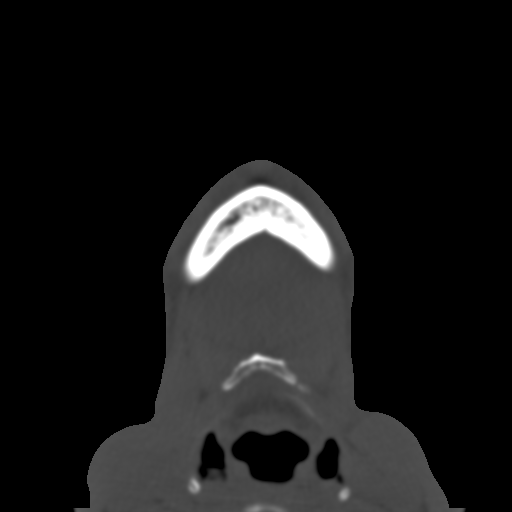
[im 22/90  bone]
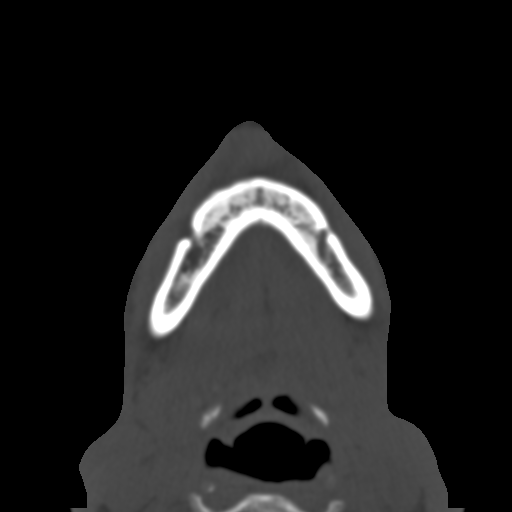
[im 28/90  brain]
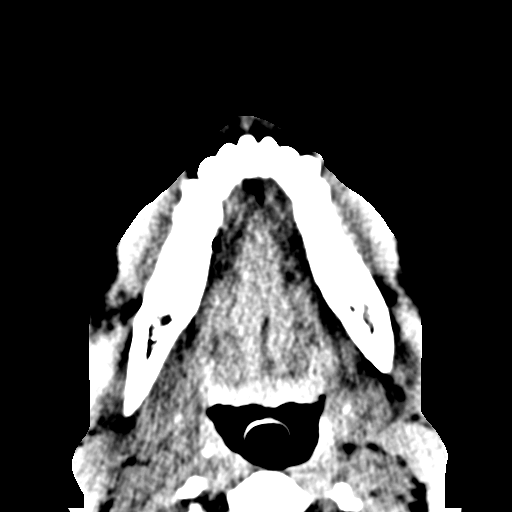
[im 28/90  bone]
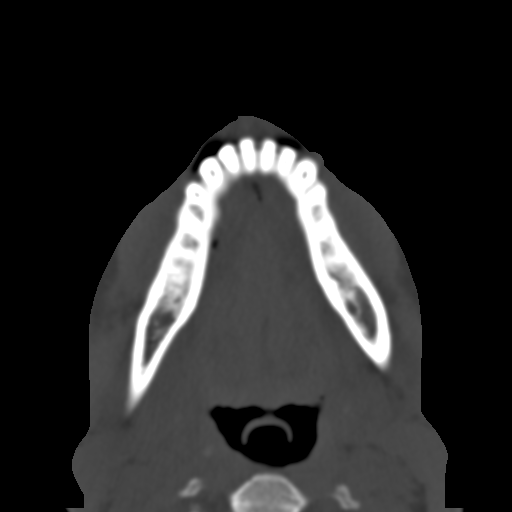
[im 31/90  bone]
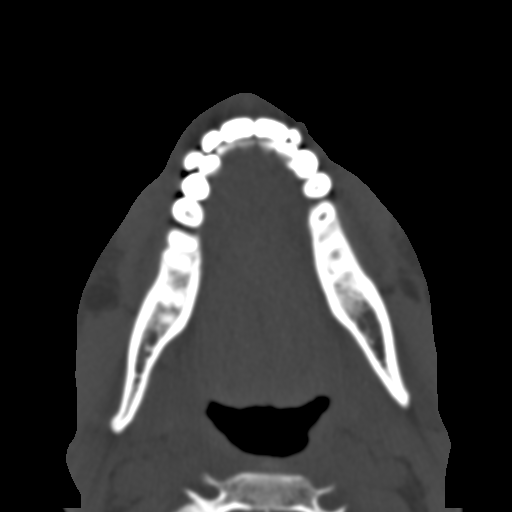
[im 40/90  bone]
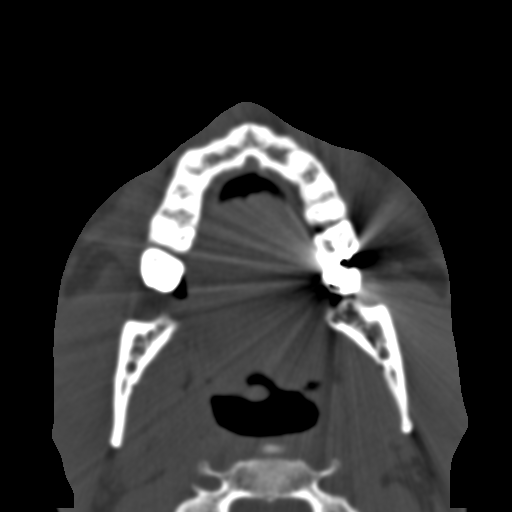
[im 47/90  bone]
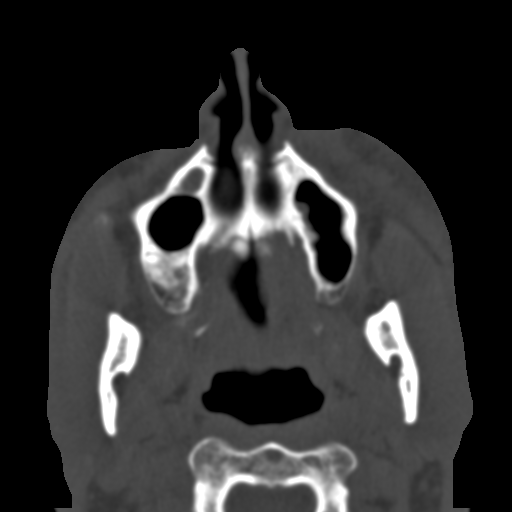
[im 53/90  brain]
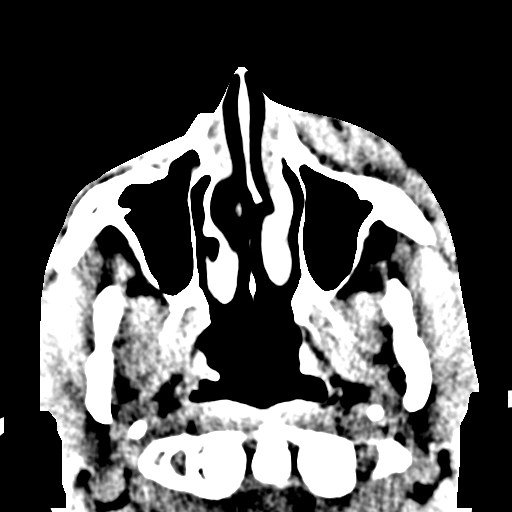
[im 53/90  bone]
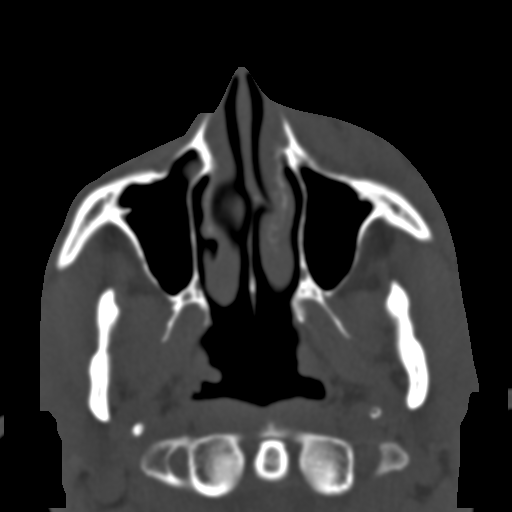
[im 59/90  bone]
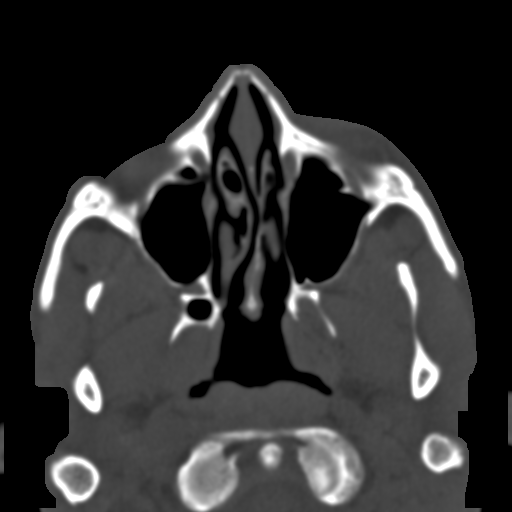
[im 62/90  bone]
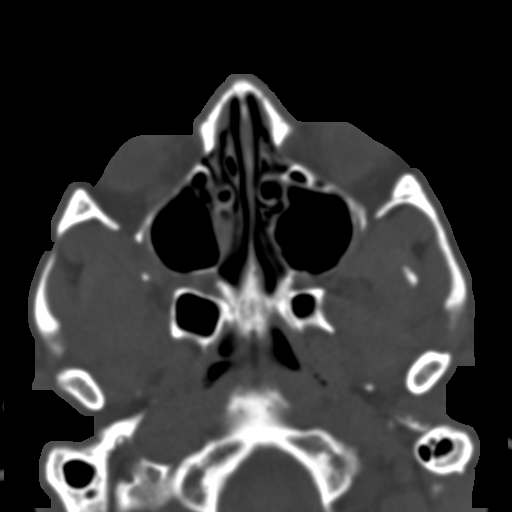
[im 68/90  bone]
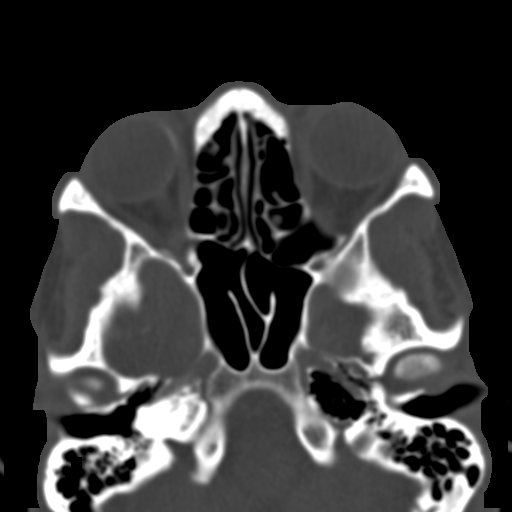
[im 74/90  brain]
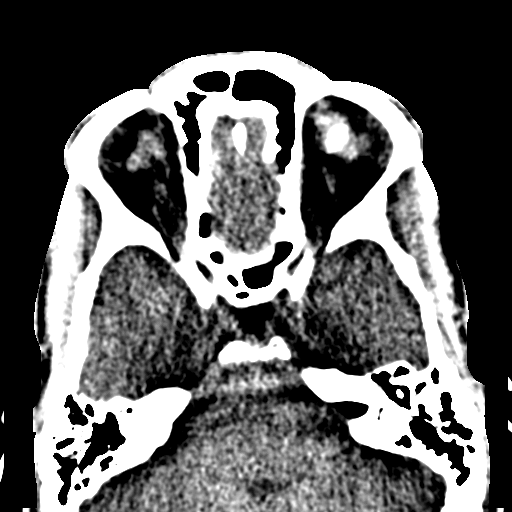
[im 74/90  bone]
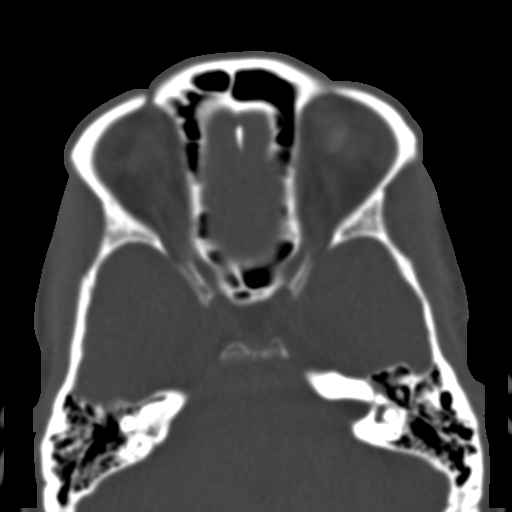
[im 80/90  bone]
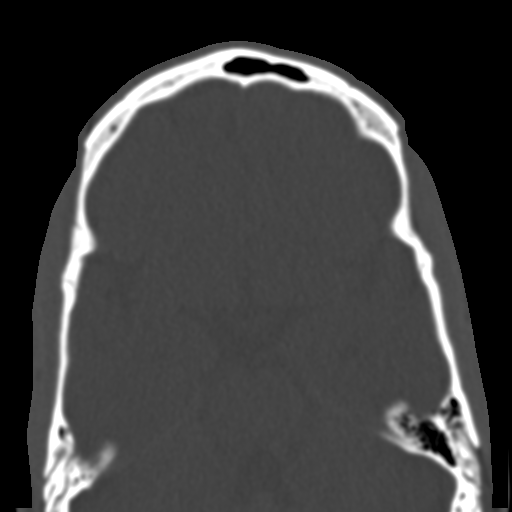
[im 86/90  bone]
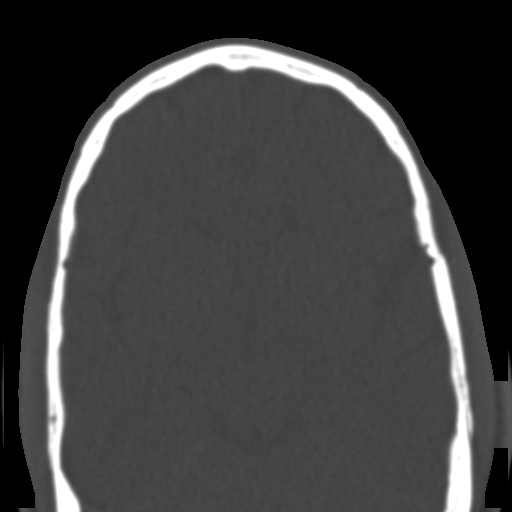

[15 of 30 positions shown; findings below may reference images not displayed]

FINDINGS: Soft tissue swelling left cheek and below the left orbit. No
underlying fracture.

Post lens replacement. Curvilinear radiopaque structure in a
symmetric fashion along the medial and superior aspect of the globes
consistent with patient's glaucoma valve placement. The globes
appear to be grossly intact.

Minimal mucosal thickening left maxillary sinus. Mild polypoid
opacification anterior medial aspect right maxillary sinus.
Remainder of visualized paranasal sinuses, mastoid air cells and
middle ear cavities are clear.

Visualized intracranial structures unremarkable.
IMPRESSION: Soft tissue swelling left cheek and below the left orbit. No
underlying fracture.

Post lens replacement. Curvilinear radiopaque structure in a
symmetric fashion along the medial and superior aspect of the globes
consistent with patient's glaucoma valve placement. The globes
appear to be grossly intact.

Minimal mucosal thickening left maxillary sinus. Mild polypoid
opacification anterior medial aspect right maxillary sinus.

## 2016-02-02 ENCOUNTER — Encounter (HOSPITAL_COMMUNITY): Payer: Self-pay | Admitting: Emergency Medicine

## 2016-02-02 ENCOUNTER — Emergency Department (HOSPITAL_COMMUNITY)
Admission: EM | Admit: 2016-02-02 | Discharge: 2016-02-02 | Disposition: A | Discharge status: Home / Self Care | Payer: BC Managed Care – PPO | Attending: Emergency Medicine | Admitting: Emergency Medicine

## 2016-02-02 ENCOUNTER — Emergency Department (HOSPITAL_COMMUNITY): Payer: BC Managed Care – PPO

## 2016-02-02 DIAGNOSIS — Z7984 Long term (current) use of oral hypoglycemic drugs: Secondary | ICD-10-CM | POA: Insufficient documentation

## 2016-02-02 DIAGNOSIS — Z7982 Long term (current) use of aspirin: Secondary | ICD-10-CM | POA: Insufficient documentation

## 2016-02-02 DIAGNOSIS — Z794 Long term (current) use of insulin: Secondary | ICD-10-CM | POA: Diagnosis not present

## 2016-02-02 DIAGNOSIS — E162 Hypoglycemia, unspecified: Secondary | ICD-10-CM

## 2016-02-02 DIAGNOSIS — E11649 Type 2 diabetes mellitus with hypoglycemia without coma: Secondary | ICD-10-CM | POA: Diagnosis present

## 2016-02-02 LAB — BASIC METABOLIC PANEL
ANION GAP: 8 (ref 5–15)
BUN: 16 mg/dL (ref 6–20)
CALCIUM: 9.7 mg/dL (ref 8.9–10.3)
CO2: 25 mmol/L (ref 22–32)
Chloride: 107 mmol/L (ref 101–111)
Creatinine, Ser: 0.82 mg/dL (ref 0.61–1.24)
GFR calc Af Amer: >60 mL/min (ref >60)
GFR calc non Af Amer: >60 mL/min (ref >60)
GLUCOSE: 73 mg/dL (ref 65–99)
Potassium: 4.1 mmol/L (ref 3.5–5.1)
Sodium: 140 mmol/L (ref 135–145)

## 2016-02-02 LAB — CBG MONITORING, ED
GLUCOSE-CAPILLARY: 48 mg/dL — AB (ref 65–99)
GLUCOSE-CAPILLARY: 84 mg/dL (ref 65–99)
Glucose-Capillary: 65 mg/dL (ref 65–99)
Glucose-Capillary: 350 mg/dL — ABNORMAL HIGH (ref 65–99)

## 2016-02-02 LAB — I-STAT TROPONIN, ED: Troponin i, poc: 0 ng/mL (ref 0.00–0.08)

## 2016-02-02 LAB — CBC WITH DIFFERENTIAL/PLATELET
BASOS ABS: 0 10*3/uL (ref 0.0–0.1)
Basophils Relative: 0 %
Eosinophils Absolute: 0.1 10*3/uL (ref 0.0–0.7)
Eosinophils Relative: 1 %
HEMATOCRIT: 44.5 % (ref 39.0–52.0)
Hemoglobin: 15.5 g/dL (ref 13.0–17.0)
LYMPHS ABS: 1.1 10*3/uL (ref 0.7–4.0)
Lymphocytes Relative: 19 %
MCH: 30.6 pg (ref 26.0–34.0)
MCHC: 34.8 g/dL (ref 30.0–36.0)
MCV: 87.8 fL (ref 78.0–100.0)
Monocytes Absolute: 0.5 10*3/uL (ref 0.1–1.0)
Monocytes Relative: 9 %
NEUTROS ABS: 4.3 10*3/uL (ref 1.7–7.7)
Neutrophils Relative %: 71 %
Platelets: 232 10*3/uL (ref 150–400)
RBC: 5.07 MIL/uL (ref 4.22–5.81)
RDW: 12.8 % (ref 11.5–15.5)
WBC: 6 10*3/uL (ref 4.0–10.5)

## 2016-02-02 LAB — TSH: TSH: 2.119 u[IU]/mL (ref 0.350–4.500)

## 2016-02-02 MED ORDER — DEXTROSE 50 % IV SOLN
1.0000 | Freq: Once | INTRAVENOUS | Status: DC
  Filled 2016-02-02: qty 50

## 2016-02-02 NOTE — ED Notes (Signed)
Pt given urinal upon his request

## 2016-02-02 NOTE — ED Notes (Signed)
Pt placed on zoll.

## 2016-02-02 NOTE — ED Notes (Signed)
Dr. Madilyn Hookees made aware of Rectal temp and that Bair hugger was placed on patient.

## 2016-02-02 NOTE — ED Notes (Signed)
Nurse will get labs 

## 2016-02-02 NOTE — ED Provider Notes (Signed)
[facility]-EMERGENCY DEPT Provider Note   CSN: [id number] Arrival date & time: 02/02/16  1809     History   Chief Complaint Chief Complaint  Patient presents with  . Hypoglycemia  . Bradycardia    HPI Edward Greer is a 51 y.o. male.  The history is provided by the patient and the EMS personnel. No language interpreter was used.    Edward Greer is a 50 y.o. male who presents to the Emergency Department complaining of AMS.  Level V caveat due to confusion.  Per EMS patient was [place] un[place] bus stop. He was found to have a blood sugar of 20 and a heart rate in the 30s. He was given 1/2 amp D50 and woke up. In the emergency department he has no recollection of events and states he missed his snack today. He has a history of diabetes. No recent medication changes. He denies any chest pain, shortness of breath, fevers, nausea, vomiting.  Past Medical History:  Diagnosis Date  . Diabetes mellitus without complication (HCC)     There are no active problems to display for this patient.   Past Surgical History:  Procedure Laterality Date  . glaucoma valve placement Bilateral   . INGUINAL HERNIA REPAIR Bilateral        Home Medications    Prior to Admission medications   Medication Sig Start Date End Date Taking? Authorizing Provider  aspirin 81 MG chewable tablet Chew 81 mg by mouth daily with breakfast.    Yes Historical Provider, MD  atorvastatin (LIPITOR) 20 MG tablet Take 20 mg by mouth daily with breakfast.    Yes Historical Provider, MD  benazepril (LOTENSIN) 20 MG tablet Take 20 mg by mouth daily.   Yes Historical Provider, MD  brimonidine (ALPHAGAN P) 0.1 % SOLN Place 1 drop into both eyes 3 (three) times daily.    Yes Historical Provider, MD  dorzolamide-timolol (COSOPT) 22.3-6.8 MG/ML ophthalmic solution Place 1 drop into both eyes 2 (two) times daily.    Yes Historical Provider, MD  glucose 4 GM chewable tablet Chew 1 tablet by mouth as needed for  low blood sugar.   Yes Historical Provider, MD  insulin aspart (NOVOLOG FLEXPEN) 100 UNIT/ML SOPN FlexPen Inject 6-12 Units into the skin See admin instructions. Takes 10units at breakfast, 6 units at lunch, and 12 units at supper   Yes Historical Provider, MD  insulin glargine (LANTUS) 100 UNIT/ML injection Inject 23 Units into the skin at bedtime.    Yes Historical Provider, MD  metFORMIN (GLUCOPHAGE) 500 MG tablet Take 1,000 mg by mouth 2 (two) times daily with a meal.   Yes Historical Provider, MD  polyvinyl alcohol (LIQUIFILM TEARS) 1.4 % ophthalmic solution Place 1 drop into both eyes every 4 (four) hours as needed for dry eyes.   Yes Historical Provider, MD  prednisoLONE acetate (PRED FORTE) 1 % ophthalmic suspension Place 1 drop into both eyes 2 (two) times daily.   Yes Historical Provider, MD    Family History No family history on file.  Social History Social History  Substance Use Topics  . Smoking status: Never Smoker  . Smokeless tobacco: Never Used  . Alcohol use No     Allergies   Review of patient's allergies indicates no known allergies.   Review of Systems Review of Systems  All other systems reviewed and are negative.    Physical Exam Updated Vital Signs BP 142/72   Pulse (!) 55   Temp 97.8  F (36.6 C) (Oral)   Resp 10   Ht 6\' 1"  (1.854 m)   Wt 150 lb (68 kg)   SpO2 100%   BMI 19.79 kg/m   Physical Exam  Constitutional: He is oriented to person, place, and time. He appears well-developed and well-nourished.  HENT:  Head: Normocephalic and atraumatic.  Cardiovascular: Regular rhythm.   No murmur heard. bradycardic  Pulmonary/Chest: Effort normal and breath sounds normal. No respiratory distress.  Abdominal: Soft. There is no tenderness. There is no rebound and no guarding.  Musculoskeletal: He exhibits no edema or tenderness.  Neurological: He is alert and oriented to person, place, and time.  Skin: Skin is warm and dry.  Psychiatric: He has a  normal mood and affect. His behavior is normal.  Nursing note and vitals reviewed.    ED Treatments / Results  Labs (all labs ordered are listed, but only abnormal results are displayed) Labs Reviewed  CBG MONITORING, ED - Abnormal; Notable for the following:       Result Value   Glucose-Capillary 48 (*)    All other components within normal limits  CBG MONITORING, ED - Abnormal; Notable for the following:    Glucose-Capillary 350 (*)    All other components within normal limits  CBC WITH DIFFERENTIAL/PLATELET  BASIC METABOLIC PANEL  TSH  CBG MONITORING, ED  I-STAT TROPOININ, ED  CBG MONITORING, ED    EKG  EKG Interpretation  Date/Time:  Friday February 02 2016 18:21:44 EDT Ventricular Rate:  38 PR Interval:    QRS Duration: 125 QT Interval:  503 QTC Calculation: 400 R Axis:   65 Text Interpretation:  Sinus bradycardia Probable left ventricular hypertrophy Baseline wander in lead(s) V2 Confirmed by Lincoln Brighamees, Liz [phone]([id number]) on 02/02/2016 7:59:52 PM       Radiology Dg Chest Portable 1 View  Result Date: 02/02/2016 CLINICAL DATA:  Syncope EXAM: PORTABLE CHEST 1 VIEW COMPARISON:  03/19/2013 chest radiograph. FINDINGS: Stable cardiomediastinal silhouette with normal heart size. No pneumothorax. No pleural effusion. Lungs appear clear, with no acute consolidative airspace disease and no pulmonary edema. IMPRESSION: No active disease. Electronically Signed   By: Delbert PhenixJason A Poff M.D.   On: 02/02/2016 20:43    Procedures Procedures (including critical care time)  Medications Ordered in ED Medications  dextrose 50 % solution 50 mL (0 mLs Intravenous Hold 02/02/16 1951)     Initial Impression / Assessment and Plan / ED Course  I have reviewed the triage vital signs and the nursing notes.  Pertinent labs & imaging results that were available during my care of the patient were reviewed by me and considered in my medical decision making (see chart for details).  Clinical Course     Patient with history of diabetes here following episode of unresponsiveness with associated hypoglycemia. His hypoglycemia resolved in the emergency department with oral replacement. EKG demonstrates sinus bradycardia and patient is asymptomatic. BMP with stable renal function. No evidence of acute infectious process. Patient was hypothermic when he arrived to the emergency department, this is thought to be secondary to his profound hypoglycemia that has now been addressed. Plan to DC home with close outpatient follow-up. Discussed with patient importance of eating regular meals.  Final Clinical Impressions(s) / ED Diagnoses   Final diagnoses:  Hypoglycemia    New Prescriptions New Prescriptions   No medications on file     Tilden FossaElizabeth Sierra Bissonette, MD 02/02/16 2356

## 2016-02-02 NOTE — ED Notes (Signed)
CBG 65 

## 2016-02-02 NOTE — ED Triage Notes (Signed)
Pt arrives via [facility], ems reports pt was found on the side of the road unresponsive. CBG was 20 and HR 36, pt diaphoretic. Pt given 1/2 amp D50, CBG up to 111. Ems reports pt remains bradycardic. Pt a/o, nad.

## 2016-02-02 NOTE — ED Notes (Signed)
Patient given sandwich and drink per Dr. Madilyn Hookees' request

## 2016-09-18 ENCOUNTER — Encounter (HOSPITAL_COMMUNITY): Payer: Self-pay

## 2016-09-18 ENCOUNTER — Emergency Department (HOSPITAL_COMMUNITY)
Admission: EM | Admit: 2016-09-18 | Discharge: 2016-09-18 | Disposition: A | Discharge status: Home / Self Care | Payer: BC Managed Care – PPO | Attending: Emergency Medicine | Admitting: Emergency Medicine

## 2016-09-18 DIAGNOSIS — Z794 Long term (current) use of insulin: Secondary | ICD-10-CM | POA: Insufficient documentation

## 2016-09-18 DIAGNOSIS — Z7984 Long term (current) use of oral hypoglycemic drugs: Secondary | ICD-10-CM | POA: Insufficient documentation

## 2016-09-18 DIAGNOSIS — E162 Hypoglycemia, unspecified: Secondary | ICD-10-CM

## 2016-09-18 DIAGNOSIS — E119 Type 2 diabetes mellitus without complications: Secondary | ICD-10-CM | POA: Insufficient documentation

## 2016-09-18 DIAGNOSIS — Z7982 Long term (current) use of aspirin: Secondary | ICD-10-CM | POA: Insufficient documentation

## 2016-09-18 DIAGNOSIS — Z79899 Other long term (current) drug therapy: Secondary | ICD-10-CM | POA: Insufficient documentation

## 2016-09-18 DIAGNOSIS — R001 Bradycardia, unspecified: Secondary | ICD-10-CM | POA: Diagnosis not present

## 2016-09-18 LAB — CBC WITH DIFFERENTIAL/PLATELET
BASOS PCT: 1 %
Basophils Absolute: 0 10*3/uL (ref 0.0–0.1)
Eosinophils Absolute: 0.1 10*3/uL (ref 0.0–0.7)
Eosinophils Relative: 1 %
HEMATOCRIT: 49.9 % (ref 39.0–52.0)
HEMOGLOBIN: 17 g/dL (ref 13.0–17.0)
LYMPHS ABS: 1.2 10*3/uL (ref 0.7–4.0)
Lymphocytes Relative: 22 %
MCH: 30.5 pg (ref 26.0–34.0)
MCHC: 34.1 g/dL (ref 30.0–36.0)
MCV: 89.4 fL (ref 78.0–100.0)
MONOS PCT: 5 %
Monocytes Absolute: 0.3 10*3/uL (ref 0.1–1.0)
NEUTROS ABS: 3.9 10*3/uL (ref 1.7–7.7)
NEUTROS PCT: 71 %
Platelets: 220 10*3/uL (ref 150–400)
RBC: 5.58 MIL/uL (ref 4.22–5.81)
RDW: 12.9 % (ref 11.5–15.5)
WBC: 5.6 10*3/uL (ref 4.0–10.5)

## 2016-09-18 LAB — COMPREHENSIVE METABOLIC PANEL
ALT: 49 U/L (ref 17–63)
ANION GAP: 8 (ref 5–15)
AST: 63 U/L — ABNORMAL HIGH (ref 15–41)
Albumin: 4.8 g/dL (ref 3.5–5.0)
Alkaline Phosphatase: 62 U/L (ref 38–126)
BUN: 14 mg/dL (ref 6–20)
CHLORIDE: 107 mmol/L (ref 101–111)
CO2: 23 mmol/L (ref 22–32)
Calcium: 9.9 mg/dL (ref 8.9–10.3)
Creatinine, Ser: 0.88 mg/dL (ref 0.61–1.24)
Glucose, Bld: 107 mg/dL — ABNORMAL HIGH (ref 65–99)
Potassium: 4.3 mmol/L (ref 3.5–5.1)
SODIUM: 138 mmol/L (ref 135–145)
Total Bilirubin: 1 mg/dL (ref 0.3–1.2)
Total Protein: 7.4 g/dL (ref 6.5–8.1)

## 2016-09-18 LAB — CBG MONITORING, ED
GLUCOSE-CAPILLARY: 86 mg/dL (ref 65–99)
GLUCOSE-CAPILLARY: 161 mg/dL — AB (ref 65–99)
GLUCOSE-CAPILLARY: 288 mg/dL — AB (ref 65–99)
Glucose-Capillary: 116 mg/dL — ABNORMAL HIGH (ref 65–99)

## 2016-09-18 LAB — TROPONIN I: Troponin I: <0.03 ng/mL (ref <0.03)

## 2016-09-18 LAB — TSH: TSH: 1.542 u[IU]/mL (ref 0.350–4.500)

## 2016-09-18 NOTE — ED Triage Notes (Signed)
Pt brought in by EMS due to having hypoglycemic event while at work. Pt was found by co-worker unresponsive. Upon arrival pt CBG was 32 and HR was in 30's. Pt given amp of D50, CBG came up to 285. Pt a&ox4. Pt has hx of DM and HTN.

## 2016-09-18 NOTE — ED Provider Notes (Signed)
Patient awake and alert. Glucose has improved substantially, he has no new complaints, temperature has improved measurably. I had a lengthy conversation with the patient and to family members about the need for outpatient follow-up with primary care to discuss his diabetes regimen, as well as considerations to make similar episodes less possible. Patient discharged in stable condition.   Gerhard MunchLockwood, Cynitha Berte, MD 09/18/16 [phone]

## 2016-09-18 NOTE — ED Provider Notes (Signed)
[facility] Provider Note   CSN: [id number] Arrival date & time: 09/18/16  1501     History   Chief Complaint Chief Complaint  Patient presents with  . Hypoglycemia  . Bradycardia    HPI Edward Greer is a 51 y.o. male.  HPI Patient presents after hypoglycemia. Found unresponsive by coworkers. Had CBG of 32 and heart rate in the 30s. Given D50 with an approval of the sugar up to 200. Has been eating since then. Had breakfast but did not have lunch. States he's been feeling well otherwise. No headache. Still  appears mildly confused. Has had episodes of this in the past. Reviewing records say also. Associated with bradycardia. Past Medical History:  Diagnosis Date  . Diabetes mellitus without complication (HCC)     There are no active problems to display for this patient.   Past Surgical History:  Procedure Laterality Date  . glaucoma valve placement Bilateral   . INGUINAL HERNIA REPAIR Bilateral        Home Medications    Prior to Admission medications   Medication Sig Start Date End Date Taking? Authorizing Provider  aspirin 81 MG chewable tablet Chew 81 mg by mouth daily with breakfast.     [provider]  atorvastatin (LIPITOR) 20 MG tablet Take 20 mg by mouth daily with breakfast.     [provider]  benazepril (LOTENSIN) 20 MG tablet Take 20 mg by mouth daily.    [provider]  brimonidine (ALPHAGAN P) 0.1 % SOLN Place 1 drop into both eyes 3 (three) times daily.     [provider]  dorzolamide-timolol (COSOPT) 22.3-6.8 MG/ML ophthalmic solution Place 1 drop into both eyes 2 (two) times daily.     [provider]  glucose 4 GM chewable tablet Chew 1 tablet by mouth as needed for low blood sugar.    [provider]  insulin aspart (NOVOLOG FLEXPEN) 100 UNIT/ML SOPN FlexPen Inject 6-12 Units into the skin See admin instructions. Takes 10units at breakfast, 6 units at lunch, and 12 units at  supper    [provider]  insulin glargine (LANTUS) 100 UNIT/ML injection Inject 23 Units into the skin at bedtime.     [provider]  metFORMIN (GLUCOPHAGE) 500 MG tablet Take 1,000 mg by mouth 2 (two) times daily with a meal.    [provider]  polyvinyl alcohol (LIQUIFILM TEARS) 1.4 % ophthalmic solution Place 1 drop into both eyes every 4 (four) hours as needed for dry eyes.    [provider]  prednisoLONE acetate (PRED FORTE) 1 % ophthalmic suspension Place 1 drop into both eyes 2 (two) times daily.    [provider]    Family History No family history on file.  Social History Social History  Substance Use Topics  . Smoking status: Never Smoker  . Smokeless tobacco: Never Used  . Alcohol use No     Allergies   Patient has no known allergies.   Review of Systems Review of Systems  Constitutional: Negative for appetite change.  HENT: Negative for congestion.   Eyes: Negative for photophobia.  Respiratory: Negative for cough.   Cardiovascular: Negative for chest pain.  Gastrointestinal: Negative for abdominal pain.  Genitourinary: Negative for frequency.  Musculoskeletal: Negative for back pain.  Neurological: Positive for syncope.  Psychiatric/Behavioral: Positive for confusion.     Physical Exam Updated Vital Signs BP (!) 171/80   Pulse (!) 44   Temp (!) 93.1 F (  33.9 C) (Rectal)   Resp 17   Ht 6\' 1"  (1.854 m)   Wt 72.6 kg (160 lb)   SpO2 100%   BMI 21.11 kg/m   Physical Exam  Constitutional: He appears well-developed.  HENT:  Head: Atraumatic.  Eyes:  Injection of bilateral sclera. Chronic  Cardiovascular:  Bradycardia  Pulmonary/Chest: Effort normal.  Abdominal: Soft. There is no tenderness.  Musculoskeletal: He exhibits no edema.  Neurological: He is alert.  Awake and appropriate but mildly slow to answer.  Skin: Skin is warm. Capillary refill takes less than 2 seconds.     ED Treatments /  Results  Labs (all labs ordered are listed, but only abnormal results are displayed) Labs Reviewed  CBG MONITORING, ED - Abnormal; Notable for the following:       Result Value   Glucose-Capillary 116 (*)    All other components within normal limits  CBC WITH DIFFERENTIAL/PLATELET  COMPREHENSIVE METABOLIC PANEL  TROPONIN I  TSH  CBG MONITORING, ED    EKG  EKG Interpretation  Date/Time:  Wednesday September 18 2016 15:22:37 EDT Ventricular Rate:  37 PR Interval:    QRS Duration: 141 QT Interval:  499 QTC Calculation: 392 R Axis:   57 Text Interpretation:  Sinus rhythm Left bundle branch block Artifact in lead(s) I II III aVR aVL aVF V1 V2 V3 V4 V5 V6 Confirmed by Rubin Payor  MD, Makael Stein ([id number]) on 09/18/2016 4:21:26 PM       Radiology No results found.  Procedures Procedures (including critical care time)  Medications Ordered in ED Medications - No data to display   Initial Impression / Assessment and Plan / ED Course  I have reviewed the triage vital signs and the nursing notes.  Pertinent labs & imaging results that were available during my care of the patient were reviewed by me and considered in my medical decision making (see chart for details).     Patient with hypoglycemia and bradycardia. History of same. Heart rate has been improving as his blood sugars come up. His mental status also improving. Labs pending. Care turned over to Dr. Jeraldine Loots.  Final Clinical Impressions(s) / ED Diagnoses   Final diagnoses:  Hypoglycemia  Bradycardia    New Prescriptions New Prescriptions   No medications on file     Benjiman Core, MD 09/18/16 1622

## 2016-09-18 NOTE — ED Notes (Signed)
Warm blankets applied and Dr. Rubin PayorPickering notified that rectal temp was 93.1.

## 2016-09-18 NOTE — ED Notes (Signed)
Pt given sandwich and graham cracker. Pt eating at this time.

## 2016-09-18 NOTE — Discharge Instructions (Signed)
As discussed, it is important that you follow up as soon as possible with your physician for continued management of your condition. ° °If you develop any new, or concerning changes in your condition, please return to the emergency department immediately. ° °

## 2017-08-17 IMAGING — CR DG CHEST 1V PORT
1 series · 1 of 1 positions shown · non-contrast
Comparison: 03/19/2013 chest radiograph.

CLINICAL DATA: Syncope

EXAM:
PORTABLE CHEST 1 VIEW

[AP]
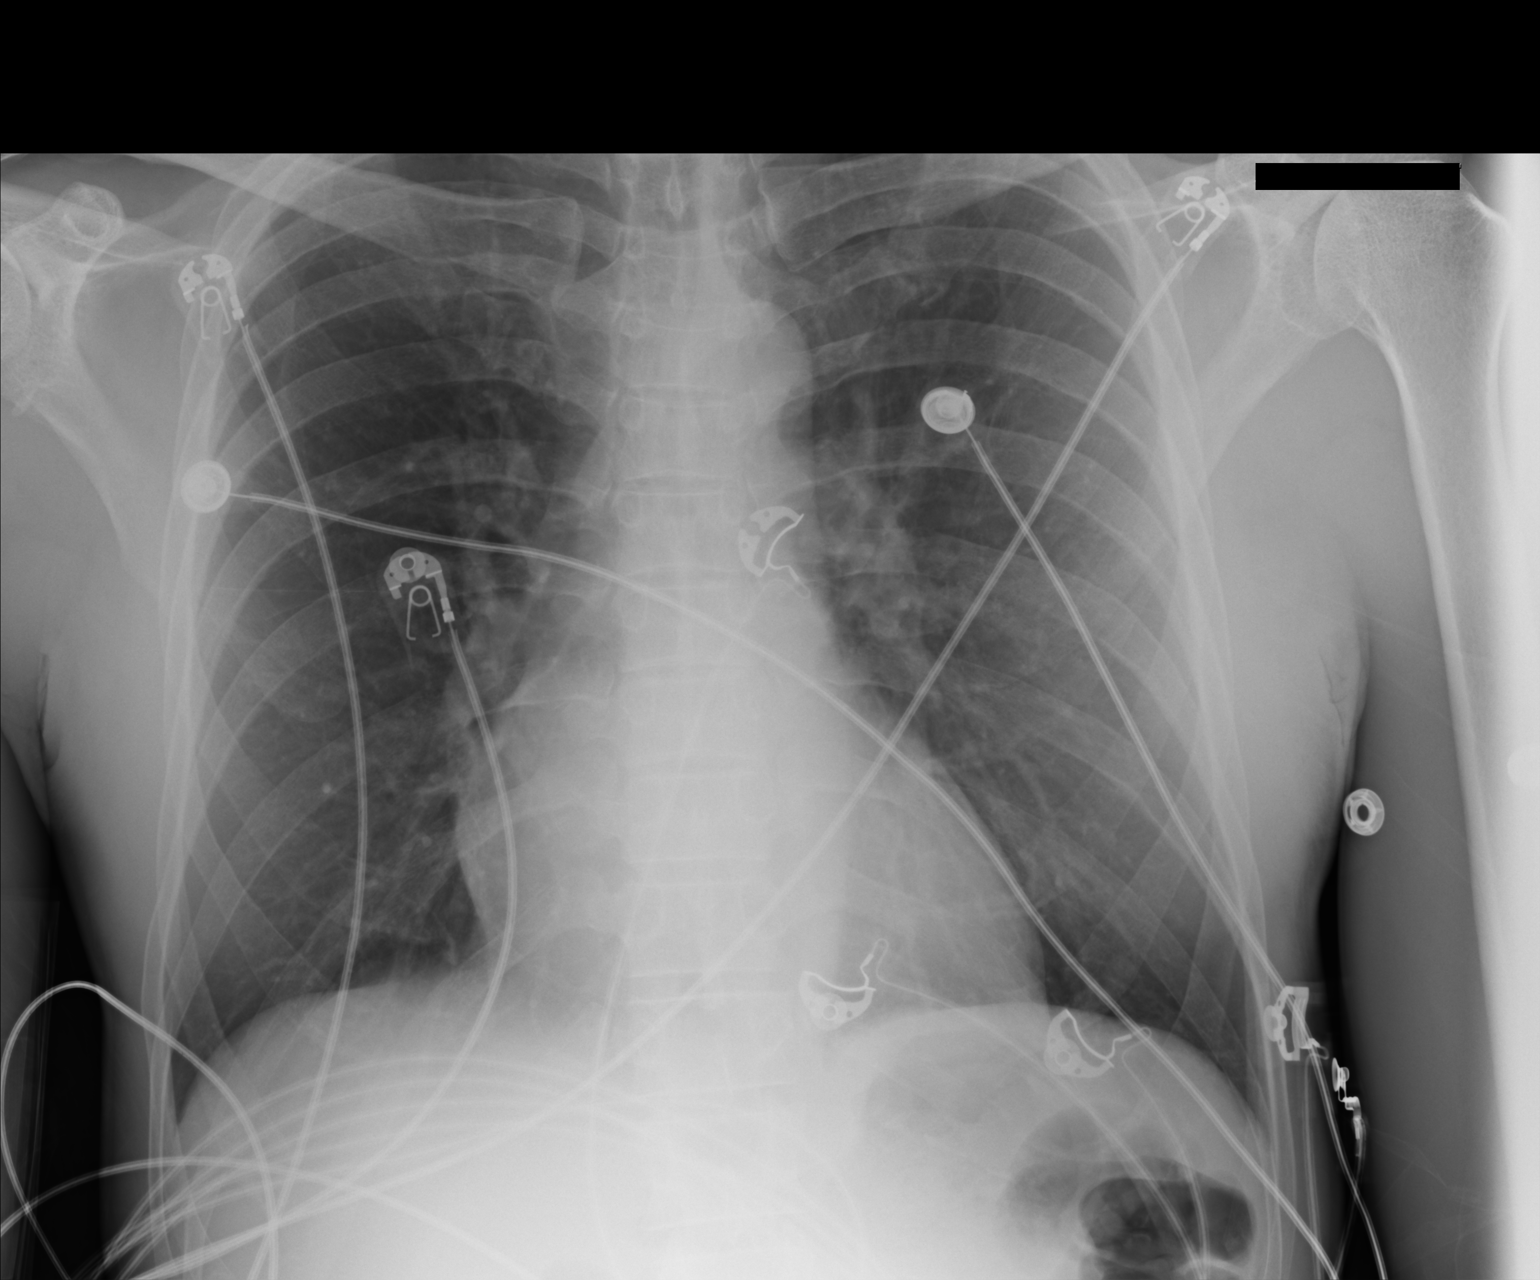

[1 of 1 positions shown; findings below may reference images not displayed]

FINDINGS: Stable cardiomediastinal silhouette with normal heart size. No
pneumothorax. No pleural effusion. Lungs appear clear, with no acute
consolidative airspace disease and no pulmonary edema.
IMPRESSION: No active disease.

## 2017-12-07 DEATH — deceased
